# Patient Record
Sex: Female | Born: 1975 | Hispanic: Yes | Marital: Married | Smoking: Former smoker
Health system: Southern US, Community
[De-identification: ages and names within clinical notes are randomized; demographics above are authoritative.]

## PROBLEM LIST (undated history)

## (undated) DIAGNOSIS — M1990 Unspecified osteoarthritis, unspecified site: Secondary | ICD-10-CM

## (undated) DIAGNOSIS — G43909 Migraine, unspecified, not intractable, without status migrainosus: Secondary | ICD-10-CM

## (undated) HISTORY — PX: TUBAL LIGATION: SHX77

## (undated) HISTORY — PX: DILATION AND CURETTAGE OF UTERUS: SHX78

---

## 2004-07-11 ENCOUNTER — Emergency Department: Payer: Self-pay | Admitting: Unknown Physician Specialty

## 2005-08-06 ENCOUNTER — Ambulatory Visit: Payer: Self-pay | Admitting: Internal Medicine

## 2005-08-19 ENCOUNTER — Ambulatory Visit: Payer: Self-pay | Admitting: Internal Medicine

## 2007-07-18 ENCOUNTER — Emergency Department: Payer: Self-pay | Admitting: Emergency Medicine

## 2009-05-24 ENCOUNTER — Emergency Department: Payer: Self-pay | Admitting: Internal Medicine

## 2009-06-16 ENCOUNTER — Ambulatory Visit: Payer: Self-pay | Admitting: Internal Medicine

## 2011-02-08 ENCOUNTER — Emergency Department: Payer: Self-pay | Admitting: Emergency Medicine

## 2015-07-14 ENCOUNTER — Emergency Department
Admission: EM | Admit: 2015-07-14 | Discharge: 2015-07-15 | Disposition: A | Discharge status: Home / Self Care | Payer: BC Managed Care – PPO | Attending: Emergency Medicine | Admitting: Emergency Medicine

## 2015-07-14 ENCOUNTER — Encounter: Payer: Self-pay | Admitting: Emergency Medicine

## 2015-07-14 ENCOUNTER — Emergency Department: Payer: BC Managed Care – PPO

## 2015-07-14 DIAGNOSIS — R079 Chest pain, unspecified: Secondary | ICD-10-CM | POA: Insufficient documentation

## 2015-07-14 DIAGNOSIS — R42 Dizziness and giddiness: Secondary | ICD-10-CM | POA: Insufficient documentation

## 2015-07-14 HISTORY — DX: Migraine, unspecified, not intractable, without status migrainosus: G43.909

## 2015-07-14 LAB — BASIC METABOLIC PANEL
Anion gap: 7 (ref 5–15)
BUN: 12 mg/dL (ref 6–20)
CHLORIDE: 105 mmol/L (ref 101–111)
CO2: 25 mmol/L (ref 22–32)
Calcium: 8.7 mg/dL — ABNORMAL LOW (ref 8.9–10.3)
Creatinine, Ser: 0.77 mg/dL (ref 0.44–1.00)
GFR calc Af Amer: >60 mL/min (ref >60)
GFR calc non Af Amer: >60 mL/min (ref >60)
GLUCOSE: 95 mg/dL (ref 65–99)
POTASSIUM: 3.6 mmol/L (ref 3.5–5.1)
Sodium: 137 mmol/L (ref 135–145)

## 2015-07-14 LAB — CBC
HEMATOCRIT: 42.4 % (ref 35.0–47.0)
Hemoglobin: 14.3 g/dL (ref 12.0–16.0)
MCH: 28.7 pg (ref 26.0–34.0)
MCHC: 33.8 g/dL (ref 32.0–36.0)
MCV: 84.8 fL (ref 80.0–100.0)
Platelets: 283 10*3/uL (ref 150–440)
RBC: 5 MIL/uL (ref 3.80–5.20)
RDW: 12.6 % (ref 11.5–14.5)
WBC: 9.8 10*3/uL (ref 3.6–11.0)

## 2015-07-14 LAB — TROPONIN I

## 2015-07-14 NOTE — ED Provider Notes (Signed)
[facility] Emergency Department Provider Note  ____________________________________________    I have reviewed the triage vital signs and the nursing notes.   HISTORY  Chief Complaint Chest Pain and Dizziness    HPI Angela Mejia is a 40 y.o. female who presents with complaints of chest discomfort and nausea. Patient reports that yesterday she developed nausea and had a brief episode of chest discomfort and felt dizzy. Today she feels well and has no complaints. No shortness of breath. No recent travel. No calf pain or swelling. No history of heart disease.     Past Medical History  Diagnosis Date  . Migraine headache     There are no active problems to display for this patient.   Past Surgical History  Procedure Laterality Date  . Dilation and curettage of uterus      No current outpatient prescriptions on file.  Allergies Review of patient's allergies indicates no known allergies.  No family history on file.  Social History Social History  Substance Use Topics  . Smoking status: Never Smoker   . Smokeless tobacco: None  . Alcohol Use: No    Review of Systems  Constitutional: Negative for fever. Eyes: Negative for redness ENT: Negative for sore throat Cardiovascular: As above Respiratory: Negative for shortness of breath. Gastrointestinal: Negative for abdominal pain Genitourinary: Negative for dysuria. Musculoskeletal: Negative for back pain. Skin: Negative for rash. Neurological: Negative for focal weakness Psychiatric: no anxiety    ____________________________________________   PHYSICAL EXAM:  VITAL SIGNS: ED Triage Vitals  Enc Vitals Group     BP 07/14/15 1839 136/96 mmHg     Pulse Rate 07/14/15 1839 66     Resp 07/14/15 1839 18     Temp 07/14/15 1839 98.6 F (37 C)     Temp Source 07/14/15 1839 Oral     SpO2 07/14/15 1839 97 %     Weight 07/14/15 1839 168 lb (76.204 kg)     Height 07/14/15 1839   (1.499 m)     Head Cir --      Peak Flow --      Pain Score 07/14/15 1840 5     Pain Loc --      Pain Edu? --      Excl. in GC? --      Constitutional: Alert and oriented. Well appearing and in no distress.  Eyes: Conjunctivae are normal. No erythema or injection ENT   Head: Normocephalic and atraumatic.   Mouth/Throat: Mucous membranes are moist. Cardiovascular: Normal rate, regular rhythm. Normal and symmetric distal pulses are present in the upper extremities.  Respiratory: Normal respiratory effort without tachypnea nor retractions. Breath sounds are clear and equal bilaterally.   Genitourinary: deferred Musculoskeletal: Nontender with normal range of motion in all extremities. No lower extremity tenderness nor edema. Neurologic:  Normal speech and language. No gross focal neurologic deficits are appreciated. Skin:  Skin is warm, dry and intact. No rash noted. Psychiatric: Mood and affect are normal. Patient exhibits appropriate insight and judgment.  ____________________________________________    LABS (pertinent positives/negatives)  Labs Reviewed  BASIC METABOLIC PANEL - Abnormal; Notable for the following:    Calcium 8.7 (*)    All other components within normal limits  CBC  TROPONIN I    ____________________________________________   EKG  ED ECG REPORT I, Jene Every, the attending physician, personally viewed and interpreted this ECG.  Date: 07/14/2015 EKG Time: 6:48 PM Rate: 81 Rhythm: normal sinus rhythm QRS Axis:  normal Intervals: normal ST/T Wave abnormalities: normal Conduction Disturbances: none Narrative Interpretation: unremarkable   ____________________________________________    RADIOLOGY  Chest x-ray normal  ____________________________________________   PROCEDURES  Procedure(s) performed: none  Critical Care performed: none  ____________________________________________   INITIAL IMPRESSION / ASSESSMENT AND PLAN  / ED COURSE  Pertinent labs & imaging results that were available during my care of the patient were reviewed by me and considered in my medical decision making (see chart for details).  Patient presents with chest pain that occurred yesterday but none today. Her lab work is unremarkable. Her EKG is normal. Her chest x-ray is benign. This is not consistent with ACS, myocarditis, dissection, PE. I have asked her to follow up with her outpatient provider. If her symptoms return she will come back to the emergency department.  ____________________________________________   FINAL CLINICAL IMPRESSION(S) / ED DIAGNOSES  Final diagnoses:  Chest pain, unspecified chest pain type          [name], MD 07/14/15 2357

## 2015-07-14 NOTE — ED Notes (Signed)
Pt presents to ED with complaints of chest pain, back pain, nausea and dizziness. Pt states vomited once yesterday. Pt denies cough. Pt denies fever.

## 2015-07-14 NOTE — Discharge Instructions (Signed)
Dolor de pecho inespecífico °(Nonspecific Chest Pain) °Suele ser difícil encontrar la causa del dolor de pecho. Siempre existe una posibilidad de que el dolor esté relacionado con algo grave, como un infarto de miocardio o un coágulo sanguíneo en los pulmones. Hay muchas enfermedades que no son potencialmente mortales que pueden causar dolor de pecho. Es importante que concurra a las visitas de control con el médico. ° °CUIDADOS EN EL HOGAR °· Si le recetaron antibióticos, asegúrese de terminarlos, incluso si comienza a sentirse mejor. °· Evite las actividades que le causen dolor de pecho. °· No consuma ningún producto que contenga tabaco, lo que incluye cigarrillos, tabaco de mascar o cigarrillos electrónicos. Si necesita ayuda para dejar de fumar, consulte al médico. °· No beba alcohol. °· Tome los medicamentos solamente como se lo haya indicado el médico. °· Concurra a todas las visitas de control como se lo haya indicado el médico. Esto es importante. Esto incluye otros estudios si el dolor de pecho no desaparece. °· El médico puede indicarle que mantenga la cabeza levantada (elevada) mientras duerme. °· Haga cambios en su estilo de vida según las indicaciones del médico. Estos pueden incluir lo siguiente: °¨ Practicar actividad física con regularidad. Pídale al médico que le sugiera algunas actividades que sean seguras para usted. °¨ Consumir una dieta cardiosaludable. El médico o un especialista en alimentación (nutricionista) pueden ayudarlo a que haga elecciones saludables. °¨ Mantener un peso saludable. °¨ Controlar la diabetes, si es necesario. °¨ Reducir las situaciones de estrés. °SOLICITE AYUDA SI: °· El dolor de pecho no desaparece, incluso después del tratamiento. °· Tiene una erupción cutánea con ampollas en el pecho. °· Tiene fiebre. °SOLICITE AYUDA DE INMEDIATO SI: °· El dolor en el pecho es más intenso. °· La tos empeora, o expectora sangre. °· Siente un dolor intenso en el vientre  (abdomen). °· Se siente muy débil. °· Pierde el conocimiento (se desmaya). °· Tiene escalofríos. °· Tiene una molestia repentina e inexplicable en el pecho. °· Tiene molestias repentinas e inexplicables en los brazos, la espalda, el cuello o la mandíbula. °· Le falta el aire en cualquier momento. °· Comienza a sudar de manera repentina o la piel se le humedece. °· Siente náuseas. °· Vomita. °· Se siente repentinamente mareado o se desmaya. °· Siente que el corazón comienza a latir rápidamente o que se saltea latidos. °Estos síntomas pueden indicar una emergencia. No espere hasta que los síntomas desaparezcan. Solicite atención médica de inmediato. Comuníquese con el servicio de emergencias de su localidad (911 en los [place]). No conduzca por sus propios medios hasta el hospital. °  °Esta información no tiene como fin reemplazar el consejo del médico. Asegúrese de hacerle al médico cualquier pregunta que tenga. °  °Document Released: 07/02/2008 Document Revised: 04/26/2014 °Elsevier Interactive Patient Education ©2016 Elsevier Inc. ° °

## 2017-10-25 ENCOUNTER — Encounter: Payer: Self-pay | Admitting: Emergency Medicine

## 2017-10-25 ENCOUNTER — Ambulatory Visit (INDEPENDENT_AMBULATORY_CARE_PROVIDER_SITE_OTHER): Payer: Self-pay

## 2017-10-25 ENCOUNTER — Ambulatory Visit
Admission: EM | Admit: 2017-10-25 | Discharge: 2017-10-25 | Disposition: A | Discharge status: Home / Self Care | Payer: Self-pay | Attending: Family Medicine | Admitting: Family Medicine

## 2017-10-25 ENCOUNTER — Other Ambulatory Visit: Payer: Self-pay

## 2017-10-25 DIAGNOSIS — Z23 Encounter for immunization: Secondary | ICD-10-CM

## 2017-10-25 DIAGNOSIS — M79645 Pain in left finger(s): Secondary | ICD-10-CM

## 2017-10-25 DIAGNOSIS — R2231 Localized swelling, mass and lump, right upper limb: Secondary | ICD-10-CM

## 2017-10-25 MED ORDER — TETANUS-DIPHTH-ACELL PERTUSSIS 5-2.5-18.5 LF-MCG/0.5 IM SUSP
0.5000 mL | Freq: Once | INTRAMUSCULAR | Status: AC
  Administered 2017-10-25: 0.5 mL via INTRAMUSCULAR

## 2017-10-25 MED ORDER — SULFAMETHOXAZOLE-TRIMETHOPRIM 800-160 MG PO TABS: 1.0000 | ORAL_TABLET | Freq: Two times a day (BID) | ORAL | 0 refills | Status: AC

## 2017-10-25 NOTE — ED Triage Notes (Signed)
Pt is here today c/o of pain, redness and swelling in the third digit of her left hand. No known injury. She tried to work today but was too painful as she works in [employer], [employer].

## 2017-10-25 NOTE — ED Provider Notes (Signed)
[facility] ____________________________________________  Time seen: Approximately 9:33 AM  I have reviewed the triage vital signs and the nursing notes.   HISTORY  Chief Complaint Hand Pain (left)   HPI Angela Mejia is a [age] y.Carlis Stableo. female presenting for evaluation of left third distal finger pain.  Patient reports that she noticed the area felt somewhat irritated yesterday but was not painful.  States today the area was more painful and had to leave work early.  States the pain is distal third finger on the outside with some redness and swelling.  Denies known trauma, insect bite, break in skin.  States that she does get some brittle nails and she pulls the area back sometimes but denies known trigger.  Denies paresthesias, pain radiation, decreased range of motion or other complaints.  States right-hand dominant.  Reports otherwise feels well.  No fevers. Denies recent sickness. Denies recent antibiotic use.  Unsure of last tetanus immunization.   Past Medical History:  Diagnosis Date  . Migraine headache     There are no active problems to display for this patient.   Past Surgical History:  Procedure Laterality Date  . DILATION AND CURETTAGE OF UTERUS       No current facility-administered medications for this encounter.   Current Outpatient Medications:  .  ibuprofen (ADVIL,MOTRIN) 200 MG tablet, Take by mouth., Disp: , Rfl:  .  sulfamethoxazole-trimethoprim (BACTRIM DS,SEPTRA DS) 800-160 MG tablet, Take 1 tablet by mouth 2 (two) times daily for 7 days., Disp: 14 tablet, Rfl: 0  Allergies Patient has no known allergies.  No family history on file.  Social History Social History   Tobacco Use  . Smoking status: Former Games developermoker  . Smokeless tobacco: Never Used  Substance Use Topics  . Alcohol use: Yes    Comment: occasionally  . Drug use: No    Review of Systems Constitutional: No fever/chills Cardiovascular: Denies chest pain. Respiratory:  Denies shortness of breath. Gastrointestinal: No abdominal pain.   Musculoskeletal: as above.  Skin: as above.    ____________________________________________   PHYSICAL EXAM:  VITAL SIGNS: ED Triage Vitals  Enc Vitals Group     BP 10/25/17 0852 124/85     Pulse Rate 10/25/17 0852 62     Resp 10/25/17 0852 17     Temp 10/25/17 0852 98.1 F (36.7 C)     Temp Source 10/25/17 0852 Oral     SpO2 10/25/17 0852 100 %     Weight 10/25/17 0852 162 lb (73.5 kg)     Height 10/25/17 0852 4\' 11"  (1.499 m)     Head Circumference --      Peak Flow --      Pain Score 10/25/17 0850 10     Pain Loc --      Pain Edu? --      Excl. in GC? --     Constitutional: Alert and oriented. Well appearing and in no acute distress. ENT      Head: Normocephalic and atraumatic. Cardiovascular: Normal rate, regular rhythm. Grossly normal heart sounds.  Good peripheral circulation. Respiratory: Normal respiratory effort without tachypnea nor retractions. Breath sounds are clear and equal bilaterally. No wheezes, rales, rhonchi. Musculoskeletal:  Steady gait.  Except: left third distal digit distal phalanx mildly erythematous and edematous, no noted paronychia, small area of peeling skin, no drainage, no fluctuance, pain with resisted flexion and extension but full range of motion present, no motor or tendon deficit. Left hand otherwise nontender and skin  intact.   Neurologic:  Normal speech and language. Speech is normal. No gait instability.  Skin:  Skin is warm, dry Psychiatric: Mood and affect are normal. Speech and behavior are normal. Patient exhibits appropriate insight and judgment   ___________________________________________   LABS (all labs ordered are listed, but only abnormal results are displayed)  Labs Reviewed - No data to display ____________________________________________  RADIOLOGY  Dg Finger Middle Left  Result Date: 10/25/2017 CLINICAL DATA:  Pain, swelling, and redness of the  middle finger began this morning principally over the distal phalanx above the cuticle and distal to the DIP joint. No known injury. EXAM: LEFT MIDDLE FINGER 2+V COMPARISON:  None in PACs FINDINGS: The bones are subjectively adequately mineralized. The joint spaces are well maintained. The soft tissues exhibit no foreign bodies or gas collections nor significant swelling. IMPRESSION: There is no acute bony or soft tissue abnormality demonstrated. Electronically Signed   By: David  [place] M.D.   On: 10/25/2017 09:46   ____________________________________________   PROCEDURES Procedures    INITIAL IMPRESSION / ASSESSMENT AND PLAN / ED COURSE  Pertinent labs & imaging results that were available during my care of the patient were reviewed by me and considered in my medical decision making (see chart for details).  Well appearing patient. No acute distress. TDaP updated.  Left third digit x-ray as above per radiologist, no bony or soft tissue changes noted.  Suspect onset of cellulitis.  Will treat with oral Bactrim.  Encouraged keeping clean, elevation, supportive care.  Discussed cough and return parameters.  Work note given for today and tomorrow.Discussed indication, risks and benefits of medications with patient.  Discussed follow up with Primary care physician this week. Discussed follow up and return parameters including no resolution or any worsening concerns. Patient verbalized understanding and agreed to plan.   ____________________________________________   FINAL CLINICAL IMPRESSION(S) / ED DIAGNOSES  Final diagnoses:  Pain of left middle finger     ED Discharge Orders        Ordered    sulfamethoxazole-trimethoprim (BACTRIM DS,SEPTRA DS) 800-160 MG tablet  2 times daily     10/25/17 0953       Note: This dictation was prepared with Dragon dictation along with smaller phrase technology. Any transcriptional errors that result from this process are unintentional.           Renford Dills, NP 10/25/17 1012

## 2017-10-25 NOTE — Discharge Instructions (Addendum)
Take medication as prescribed. Keep clean. Ice.   Follow up with your primary care physician this week as needed. Return to Urgent care for new or worsening concerns.

## 2017-11-11 ENCOUNTER — Ambulatory Visit
Admission: EM | Admit: 2017-11-11 | Discharge: 2017-11-11 | Disposition: A | Discharge status: Home / Self Care | Payer: Self-pay | Attending: Emergency Medicine | Admitting: Emergency Medicine

## 2017-11-11 ENCOUNTER — Other Ambulatory Visit: Payer: Self-pay

## 2017-11-11 ENCOUNTER — Encounter: Payer: Self-pay | Admitting: Emergency Medicine

## 2017-11-11 DIAGNOSIS — R05 Cough: Secondary | ICD-10-CM

## 2017-11-11 DIAGNOSIS — J069 Acute upper respiratory infection, unspecified: Secondary | ICD-10-CM

## 2017-11-11 DIAGNOSIS — B9789 Other viral agents as the cause of diseases classified elsewhere: Secondary | ICD-10-CM

## 2017-11-11 MED ORDER — HYDROCOD POLST-CPM POLST ER 10-8 MG/5ML PO SUER: 5.0000 mL | Freq: Every evening | ORAL | 0 refills | Status: DC | PRN

## 2017-11-11 MED ORDER — BENZONATATE 100 MG PO CAPS: 100.0000 mg | ORAL_CAPSULE | Freq: Three times a day (TID) | ORAL | 0 refills | Status: DC | PRN

## 2017-11-11 NOTE — Discharge Instructions (Addendum)
Take medication as prescribed. Rest. Drink plenty of fluids.  ° °Follow up with your primary care physician this week as needed. Return to Urgent care for new or worsening concerns.  ° °

## 2017-11-11 NOTE — ED Provider Notes (Signed)
[facility] ____________________________________________  Time seen: Approximately 8:30 AM  I have reviewed the triage vital signs and the nursing notes.   HISTORY  Chief Complaint Cough   HPI Angela Mejia is a 42 y.o. female presenting for evaluation of 4 days of runny nose, nasal congestion, cough.  Reports she did have some scratchy throat now throat only seems dry.  States initially she was having subjective fevers but is since resolved.  States the nasal congestion also is improving.  States that she is continue with a dry hacking cough.  States that she feels sore from coughing so much.  Denies chest pain or shortness of breath but states again just sore from coughing.  Reports her husband recently had similar just prior to her sickness.  Has continued to remain active.  Continues to work.  Worked last night.  Continues to eat and drink.  Has taken some over-the-counter TheraFlu which is helped some.  No other over-the-counter medications taken of the same complaints.  Denies other aggravating factors. Denies recent sickness.   Patient's last menstrual period was 11/01/2017 (approximate). denies pregnancy.    Past Medical History:  Diagnosis Date  . Migraine headache     There are no active problems to display for this patient.   Past Surgical History:  Procedure Laterality Date  . DILATION AND CURETTAGE OF UTERUS       No current facility-administered medications for this encounter.   Current Outpatient Medications:  .  benzonatate (TESSALON PERLES) 100 MG capsule, Take 1 capsule (100 mg total) by mouth 3 (three) times daily as needed for cough., Disp: 15 capsule, Rfl: 0 .  chlorpheniramine-HYDROcodone (TUSSIONEX PENNKINETIC ER) 10-8 MG/5ML SUER, Take 5 mLs by mouth at bedtime as needed for cough. do not drive or operate machinery while taking as can cause drowsiness., Disp: 50 mL, Rfl: 0 .  ibuprofen (ADVIL,MOTRIN) 200 MG tablet, Take by mouth., Disp: ,  Rfl:   Allergies Patient has no known allergies.  History reviewed. No pertinent family history.  Social History Social History   Tobacco Use  . Smoking status: Former Games developer  . Smokeless tobacco: Never Used  Substance Use Topics  . Alcohol use: Yes    Comment: occasionally  . Drug use: No    Review of Systems Constitutional: Subjective fevers. ENT: As above.  Cardiovascular: Denies chest pain. Respiratory: Denies shortness of breath. Gastrointestinal: No abdominal pain.  No nausea, no vomiting.  No diarrhea.   Musculoskeletal: Negative for back pain. Skin: Negative for rash.   ____________________________________________   PHYSICAL EXAM:  VITAL SIGNS: ED Triage Vitals  Enc Vitals Group     BP 11/11/17 0820 112/86     Pulse Rate 11/11/17 0820 71     Resp 11/11/17 0820 14     Temp 11/11/17 0820 98.2 F (36.8 C)     Temp Source 11/11/17 0820 Oral     SpO2 11/11/17 0820 99 %     Weight 11/11/17 0817 162 lb (73.5 kg)     Height 11/11/17 0817 4\' 11"  (1.499 m)     Head Circumference --      Peak Flow --      Pain Score 11/11/17 0816 7     Pain Loc --      Pain Edu? --      Excl. in GC? --     Constitutional: Alert and oriented. Well appearing and in no acute distress. Eyes: Conjunctivae are normal.  Head: Atraumatic. No sinus  tenderness to palpation. No swelling. No erythema.  Ears: no erythema, normal TMs bilaterally.   Nose:Nasal congestion with clear rhinorrhea  Mouth/Throat: Mucous membranes are moist. No pharyngeal erythema. No tonsillar swelling or exudate.  Neck: No stridor.  No cervical spine tenderness to palpation. Hematological/Lymphatic/Immunilogical: No cervical lymphadenopathy. Cardiovascular: Normal rate, regular rhythm. Grossly normal heart sounds.  Good peripheral circulation. Respiratory: Normal respiratory effort.  No retractions. No wheezes, rales or rhonchi. Good air movement.  Musculoskeletal: Ambulatory with steady gait. No cervical,  thoracic or lumbar tenderness to palpation.  Bilateral lower extremities no edema noted.  Bilateral posterior tibialis pulses equal. Neurologic:  Normal speech and language. No gait instability. Skin:  Skin appears warm, dry and intact. No rash noted. Psychiatric: Mood and affect are normal. Speech and behavior are normal.  ___________________________________________   LABS (all labs ordered are listed, but only abnormal results are displayed)  Labs Reviewed - No data to display  PROCEDURES Procedures     INITIAL IMPRESSION / ASSESSMENT AND PLAN / ED COURSE  Pertinent labs & imaging results that were available during my care of the patient were reviewed by me and considered in my medical decision making (see chart for details).  Well-appearing patient.  No acute distress.  Suspect viral upper respiratory infection.Encourage rest, fluids, supportive care.  PRN Tessalon Perles and PRN Tussionex.  Work note given for [employer]. Discussed indication, risks and benefits of medications with patient.  Discussed follow up with Primary care physician this week. Discussed follow up and return parameters including no resolution or any worsening concerns. Patient verbalized understanding and agreed to plan.   ____________________________________________   FINAL CLINICAL IMPRESSION(S) / ED DIAGNOSES  Final diagnoses:  Viral URI with cough     ED Discharge Orders        Ordered    chlorpheniramine-HYDROcodone (TUSSIONEX PENNKINETIC ER) 10-8 MG/5ML SUER  At bedtime PRN     11/11/17 0832    benzonatate (TESSALON PERLES) 100 MG capsule  3 times daily PRN     11/11/17 [id number]       Note: This dictation was prepared with Dragon dictation along with smaller phrase technology. Any transcriptional errors that result from this process are unintentional.         Renford DillsMiller, Kelilah Hebard, NP 11/11/17 [id number]

## 2017-11-11 NOTE — ED Triage Notes (Signed)
Patient c/o cough and chest congestion since Monday.  Patient reports fever yesterday.

## 2018-04-25 ENCOUNTER — Other Ambulatory Visit: Payer: Self-pay

## 2018-04-25 ENCOUNTER — Emergency Department
Admission: EM | Admit: 2018-04-25 | Discharge: 2018-04-25 | Disposition: A | Discharge status: Home / Self Care | Payer: BLUE CROSS/BLUE SHIELD | Attending: Emergency Medicine | Admitting: Emergency Medicine

## 2018-04-25 ENCOUNTER — Encounter: Payer: Self-pay | Admitting: Emergency Medicine

## 2018-04-25 DIAGNOSIS — Z87891 Personal history of nicotine dependence: Secondary | ICD-10-CM | POA: Insufficient documentation

## 2018-04-25 DIAGNOSIS — R42 Dizziness and giddiness: Secondary | ICD-10-CM | POA: Diagnosis present

## 2018-04-25 DIAGNOSIS — R002 Palpitations: Secondary | ICD-10-CM | POA: Diagnosis not present

## 2018-04-25 LAB — CBC
HCT: 41.3 % (ref 36.0–46.0)
HEMOGLOBIN: 14.1 g/dL (ref 12.0–15.0)
MCH: 28.8 pg (ref 26.0–34.0)
MCHC: 34.1 g/dL (ref 30.0–36.0)
MCV: 84.5 fL (ref 80.0–100.0)
Platelets: 309 10*3/uL (ref 150–400)
RBC: 4.89 MIL/uL (ref 3.87–5.11)
RDW: 11.9 % (ref 11.5–15.5)
WBC: 10.8 10*3/uL — ABNORMAL HIGH (ref 4.0–10.5)
nRBC: 0 % (ref 0.0–0.2)

## 2018-04-25 LAB — URINALYSIS, COMPLETE (UACMP) WITH MICROSCOPIC
Bilirubin Urine: NEGATIVE
Glucose, UA: NEGATIVE mg/dL
Ketones, ur: NEGATIVE mg/dL
Leukocytes, UA: NEGATIVE
NITRITE: NEGATIVE
PROTEIN: NEGATIVE mg/dL
Specific Gravity, Urine: 1.011 (ref 1.005–1.030)
WBC UA: NONE SEEN WBC/hpf (ref 0–5)
pH: 7 (ref 5.0–8.0)

## 2018-04-25 LAB — BASIC METABOLIC PANEL
Anion gap: 8 (ref 5–15)
BUN: 16 mg/dL (ref 6–20)
CHLORIDE: 103 mmol/L (ref 98–111)
CO2: 24 mmol/L (ref 22–32)
Calcium: 9.1 mg/dL (ref 8.9–10.3)
Creatinine, Ser: 0.68 mg/dL (ref 0.44–1.00)
GFR calc Af Amer: >60 mL/min (ref >60)
GFR calc non Af Amer: >60 mL/min (ref >60)
Glucose, Bld: 74 mg/dL (ref 70–99)
POTASSIUM: 3.9 mmol/L (ref 3.5–5.1)
Sodium: 135 mmol/L (ref 135–145)

## 2018-04-25 NOTE — ED Triage Notes (Signed)
Pt presents to ED via EMS from work with c/o dizziness. Pt states around 2323 she felt like her heart was racing but it resolved on its own. Occurred again at 0423 but pt reports feeling dizzy and nauseated. Pt states she put her head down for a few min but pt did not have any relief. Pt currently has no symptoms.

## 2018-04-25 NOTE — ED Provider Notes (Signed)
[facility] Emergency Department Provider Note  ____________________________________________  Time seen: Approximately 10:17 AM  I have reviewed the triage vital signs and the nursing notes.   HISTORY  Chief Complaint Dizziness    HPI Angela Mejia is a 43 y.o. female with a history of migraine headache who complains of an episode of dizziness feeling lightheaded and feeling like her heart was racing that started at about 11:30 PM last night.  Lasted for about 20 or 30 minutes and then resolved.  Had another episode at about 4:00.  No syncope or chest pain or shortness of breath.  No vomiting or diaphoresis.  No exertional symptoms.  Denies any family history of sudden death.  Since arrival to the ED, patient has been asymptomatic and remained asymptomatic.      Past Medical History:  Diagnosis Date  . Migraine headache      There are no active problems to display for this patient.    Past Surgical History:  Procedure Laterality Date  . DILATION AND CURETTAGE OF UTERUS       Prior to Admission medications   Medication Sig Start Date End Date Taking? Authorizing Provider  ibuprofen (ADVIL,MOTRIN) 200 MG tablet Take 200 mg by mouth every 6 (six) hours as needed.    Yes [provider]     Allergies Patient has no known allergies.   No family history on file.  Social History Social History   Tobacco Use  . Smoking status: Former Games developer  . Smokeless tobacco: Never Used  Substance Use Topics  . Alcohol use: Yes    Comment: occasionally  . Drug use: No    Review of Systems  Constitutional:   No fever or chills.  ENT:   No sore throat. No rhinorrhea. Cardiovascular: Positive as above palpitations without syncope. Respiratory:   No dyspnea or cough. Gastrointestinal:   Negative for abdominal pain, vomiting and diarrhea.  Musculoskeletal:   Negative for focal pain or swelling All other systems reviewed and are negative  except as documented above in ROS and HPI.  ____________________________________________   PHYSICAL EXAM:  VITAL SIGNS: ED Triage Vitals  Enc Vitals Group     BP 04/25/18 0532 121/87     Pulse Rate 04/25/18 0532 77     Resp 04/25/18 0532 (!) 22     Temp 04/25/18 0532 98.1 F (36.7 C)     Temp Source 04/25/18 0532 Oral     SpO2 04/25/18 0532 100 %     Weight 04/25/18 0533 158 lb (71.7 kg)     Height 04/25/18 0533 4\' 11"  (1.499 m)     Head Circumference --      Peak Flow --      Pain Score 04/25/18 0554 0     Pain Loc --      Pain Edu? --      Excl. in GC? --     Vital signs reviewed, nursing assessments reviewed.   Constitutional:   Alert and oriented. Non-toxic appearance. Eyes:   Conjunctivae are normal. EOMI. PERRL. ENT      Head:   Normocephalic and atraumatic.      Nose:   No congestion/rhinnorhea.       Mouth/Throat:   MMM, no pharyngeal erythema. No peritonsillar mass.       Neck:   No meningismus. Full ROM.  However nonpalpable Hematological/Lymphatic/Immunilogical:   No cervical lymphadenopathy. Cardiovascular:   RRR. Symmetric bilateral radial and DP pulses.  No murmurs.  Cap refill less than 2 seconds. Respiratory:   Normal respiratory effort without tachypnea/retractions. Breath sounds are clear and equal bilaterally. No wheezes/rales/rhonchi. Gastrointestinal:   Soft and nontender. Non distended. There is no CVA tenderness.  No rebound, rigidity, or guarding.  Musculoskeletal:   Normal range of motion in all extremities. No joint effusions.  No lower extremity tenderness.  No edema. Neurologic:   Normal speech and language.  Motor grossly intact. No acute focal neurologic deficits are appreciated.  Skin:    Skin is warm, dry and intact. No rash noted.  No petechiae, purpura, or bullae.  ____________________________________________    LABS (pertinent positives/negatives) (all labs ordered are listed, but only abnormal results are displayed) Labs  Reviewed  CBC - Abnormal; Notable for the following components:      Result Value   WBC 10.8 (*)    All other components within normal limits  URINALYSIS, COMPLETE (UACMP) WITH MICROSCOPIC - Abnormal; Notable for the following components:   Color, Urine STRAW (*)    APPearance CLEAR (*)    Hgb urine dipstick SMALL (*)    Bacteria, UA RARE (*)    All other components within normal limits  BASIC METABOLIC PANEL  CBG MONITORING, ED  POC URINE PREG, ED   ____________________________________________   EKG  Interpreted by me  Date: 04/25/2018  Rate: 70  Rhythm: normal sinus rhythm  QRS Axis: normal  Intervals: normal  ST/T Wave abnormalities: normal  Conduction Disutrbances: none  Narrative Interpretation: unremarkable      ____________________________________________    RADIOLOGY  No results found.  ____________________________________________   PROCEDURES Procedures  ____________________________________________    CLINICAL IMPRESSION / ASSESSMENT AND PLAN / ED COURSE  Pertinent labs & imaging results that were available during my care of the patient were reviewed by me and considered in my medical decision making (see chart for details).    Patient presents with palpitations and dizziness.  No exertional symptoms, no history of syncope or family history of sudden death.  She has no significant medical history, vital signs are normal, doubt hyperthyroidism pheochromocytoma or other endocrine disorder.Considering the patient's symptoms, medical history, and physical examination today, I have low suspicion for ACS, PE, TAD, pneumothorax, carditis, mediastinitis, pneumonia, CHF, or sepsis.  Vital signs labs EKG all normal today.  Recommend follow-up with cardiology for consideration of event monitor.      ____________________________________________   FINAL CLINICAL IMPRESSION(S) / ED DIAGNOSES    Final diagnoses:  Dizziness  Palpitations     ED  Discharge Orders    None      Portions of this note were generated with dragon dictation software. Dictation errors may occur despite best attempts at proofreading.   Sharman Cheek, MD 04/25/18 1019

## 2018-06-25 NOTE — Progress Notes (Deleted)
   New Outpatient Visit Date: 06/26/2018  Referring Provider: Sharman Cheek, MD [facility] Emergency Department  Chief Complaint: ***  HPI:  Angela Mejia is a 43 y.o. female who is being seen today for the evaluation of dizziness and palpitations at the request of Dr. Scotty Court. She has a history of migraine headaches. She presented to the [facility] ED on 04/25/2018 complaining of dizziness and feeling as though her heart were racing.  ED workup was unrevealing.  --------------------------------------------------------------------------------------------------  Cardiovascular History & Procedures: Cardiovascular Problems:  Palpitations and dizziness  Risk Factors:  None  Cath/PCI:  None  CV Surgery:  None  EP Procedures and Devices:  None  Non-Invasive Evaluation(s):  None  Recent CV Pertinent Labs: Lab Results  Component Value Date   K 3.9 04/25/2018   BUN 16 04/25/2018   CREATININE 0.68 04/25/2018    --------------------------------------------------------------------------------------------------  Past Medical History:  Diagnosis Date  . Migraine headache     Past Surgical History:  Procedure Laterality Date  . DILATION AND CURETTAGE OF UTERUS      No outpatient medications have been marked as taking for the 06/26/18 encounter (Appointment) with Oluwadamilare Tobler, Cristal Deer, MD.    Allergies: Patient has no known allergies.  Social History   Tobacco Use  . Smoking status: Former Games developer  . Smokeless tobacco: Never Used  Substance Use Topics  . Alcohol use: Yes    Comment: occasionally  . Drug use: No    No family history on file.  Review of Systems: A 12-system review of systems was performed and was negative except as noted in the HPI.  --------------------------------------------------------------------------------------------------  Physical Exam: There were no vitals taken for this visit.  General:  *** HEENT: No conjunctival pallor or scleral icterus.  Moist mucous membranes. OP clear. Neck: Supple without lymphadenopathy, thyromegaly, JVD, or HJR. No carotid bruit. Lungs: Normal work of breathing. Clear to auscultation bilaterally without wheezes or crackles. Heart: Regular rate and rhythm without murmurs, rubs, or gallops. Non-displaced PMI. Abd: Bowel sounds present. Soft, NT/ND without hepatosplenomegaly Ext: No lower extremity edema. Radial, PT, and DP pulses are 2+ bilaterally Skin: Warm and dry without rash. Neuro: CNIII-XII intact. Strength and fine-touch sensation intact in upper and lower extremities bilaterally. Psych: Normal mood and affect.  EKG:  ***  Lab Results  Component Value Date   WBC 10.8 (H) 04/25/2018   HGB 14.1 04/25/2018   HCT 41.3 04/25/2018   MCV 84.5 04/25/2018   PLT 309 04/25/2018    Lab Results  Component Value Date   NA 135 04/25/2018   K 3.9 04/25/2018   CL 103 04/25/2018   CO2 24 04/25/2018   BUN 16 04/25/2018   CREATININE 0.68 04/25/2018   GLUCOSE 74 04/25/2018    No results found for: CHOL, HDL, LDLCALC, LDLDIRECT, TRIG, CHOLHDL   --------------------------------------------------------------------------------------------------  ASSESSMENT AND PLAN: ***  Yvonne Kendall, MD 06/25/2018 7:45 PM

## 2018-06-26 ENCOUNTER — Ambulatory Visit: Payer: BLUE CROSS/BLUE SHIELD | Admitting: Internal Medicine

## 2018-06-29 ENCOUNTER — Ambulatory Visit: Payer: BLUE CROSS/BLUE SHIELD | Admitting: Internal Medicine

## 2018-11-08 ENCOUNTER — Other Ambulatory Visit: Payer: Self-pay

## 2018-11-08 ENCOUNTER — Observation Stay
Admission: EM | Admit: 2018-11-08 | Discharge: 2018-11-09 | Disposition: A | Discharge status: Home / Self Care | Payer: BC Managed Care – PPO | Attending: Internal Medicine | Admitting: Internal Medicine

## 2018-11-08 ENCOUNTER — Observation Stay: Payer: BC Managed Care – PPO

## 2018-11-08 ENCOUNTER — Emergency Department: Payer: BC Managed Care – PPO

## 2018-11-08 DIAGNOSIS — Z1159 Encounter for screening for other viral diseases: Secondary | ICD-10-CM | POA: Insufficient documentation

## 2018-11-08 DIAGNOSIS — M542 Cervicalgia: Principal | ICD-10-CM | POA: Insufficient documentation

## 2018-11-08 DIAGNOSIS — Z87891 Personal history of nicotine dependence: Secondary | ICD-10-CM | POA: Diagnosis not present

## 2018-11-08 DIAGNOSIS — M79602 Pain in left arm: Secondary | ICD-10-CM | POA: Diagnosis present

## 2018-11-08 DIAGNOSIS — G43909 Migraine, unspecified, not intractable, without status migrainosus: Secondary | ICD-10-CM | POA: Insufficient documentation

## 2018-11-08 DIAGNOSIS — R29898 Other symptoms and signs involving the musculoskeletal system: Secondary | ICD-10-CM | POA: Insufficient documentation

## 2018-11-08 DIAGNOSIS — K219 Gastro-esophageal reflux disease without esophagitis: Secondary | ICD-10-CM | POA: Diagnosis not present

## 2018-11-08 LAB — CBC
HCT: 43.2 % (ref 36.0–46.0)
Hemoglobin: 14.6 g/dL (ref 12.0–15.0)
MCH: 29.3 pg (ref 26.0–34.0)
MCHC: 33.8 g/dL (ref 30.0–36.0)
MCV: 86.6 fL (ref 80.0–100.0)
Platelets: 284 10*3/uL (ref 150–400)
RBC: 4.99 MIL/uL (ref 3.87–5.11)
RDW: 12 % (ref 11.5–15.5)
WBC: 8.5 10*3/uL (ref 4.0–10.5)
nRBC: 0 % (ref 0.0–0.2)

## 2018-11-08 LAB — COMPREHENSIVE METABOLIC PANEL
ALT: 21 U/L (ref 0–44)
AST: 26 U/L (ref 15–41)
Albumin: 4.3 g/dL (ref 3.5–5.0)
Alkaline Phosphatase: 59 U/L (ref 38–126)
Anion gap: 9 (ref 5–15)
BUN: 8 mg/dL (ref 6–20)
CO2: 20 mmol/L — ABNORMAL LOW (ref 22–32)
Calcium: 8.9 mg/dL (ref 8.9–10.3)
Chloride: 106 mmol/L (ref 98–111)
Creatinine, Ser: 0.69 mg/dL (ref 0.44–1.00)
GFR calc Af Amer: >60 mL/min (ref >60)
GFR calc non Af Amer: >60 mL/min (ref >60)
Glucose, Bld: 104 mg/dL — ABNORMAL HIGH (ref 70–99)
Potassium: 3.6 mmol/L (ref 3.5–5.1)
Sodium: 135 mmol/L (ref 135–145)
Total Bilirubin: 0.6 mg/dL (ref 0.3–1.2)
Total Protein: 7.8 g/dL (ref 6.5–8.1)

## 2018-11-08 LAB — POCT PREGNANCY, URINE: Preg Test, Ur: NEGATIVE

## 2018-11-08 LAB — URINALYSIS, COMPLETE (UACMP) WITH MICROSCOPIC
Bacteria, UA: NONE SEEN
Bilirubin Urine: NEGATIVE
Glucose, UA: NEGATIVE mg/dL
Ketones, ur: NEGATIVE mg/dL
Leukocytes,Ua: NEGATIVE
Nitrite: NEGATIVE
Protein, ur: NEGATIVE mg/dL
Specific Gravity, Urine: 1.005 (ref 1.005–1.030)
WBC, UA: NONE SEEN WBC/hpf (ref 0–5)
pH: 7 (ref 5.0–8.0)

## 2018-11-08 LAB — TROPONIN I (HIGH SENSITIVITY): Troponin I (High Sensitivity): <2 ng/L (ref <18)

## 2018-11-08 LAB — LIPASE, BLOOD: Lipase: 27 U/L (ref 11–51)

## 2018-11-08 LAB — SARS CORONAVIRUS 2 BY RT PCR (HOSPITAL ORDER, PERFORMED IN ~~LOC~~ HOSPITAL LAB): SARS Coronavirus 2: NEGATIVE

## 2018-11-08 MED ORDER — ONDANSETRON HCL 4 MG/2ML IJ SOLN
INTRAMUSCULAR | Status: AC
  Administered 2018-11-08: 13:00:00 4 mg via INTRAVENOUS
  Filled 2018-11-08: qty 2

## 2018-11-08 MED ORDER — STROKE: EARLY STAGES OF RECOVERY BOOK
Freq: Once | Status: AC
  Administered 2018-11-08: 18:00:00

## 2018-11-08 MED ORDER — MORPHINE SULFATE (PF) 4 MG/ML IV SOLN
4.0000 mg | Freq: Once | INTRAVENOUS | Status: AC
  Administered 2018-11-08: 13:00:00 4 mg via INTRAVENOUS

## 2018-11-08 MED ORDER — SODIUM CHLORIDE 0.9% FLUSH: 3.0000 mL | Freq: Once | INTRAVENOUS | Status: DC

## 2018-11-08 MED ORDER — ACETAMINOPHEN 160 MG/5ML PO SOLN
650.0000 mg | ORAL | Status: DC | PRN
  Filled 2018-11-08: qty 20.3

## 2018-11-08 MED ORDER — ACETAMINOPHEN 650 MG RE SUPP: 650.0000 mg | RECTAL | Status: DC | PRN

## 2018-11-08 MED ORDER — ASPIRIN 81 MG PO CHEW
324.0000 mg | CHEWABLE_TABLET | Freq: Once | ORAL | Status: AC
  Administered 2018-11-08: 18:00:00 324 mg via ORAL
  Filled 2018-11-08: qty 4

## 2018-11-08 MED ORDER — MORPHINE SULFATE (PF) 4 MG/ML IV SOLN
INTRAVENOUS | Status: AC
  Administered 2018-11-08: 4 mg via INTRAVENOUS
  Filled 2018-11-08: qty 1

## 2018-11-08 MED ORDER — IOHEXOL 350 MG/ML SOLN
75.0000 mL | Freq: Once | INTRAVENOUS | Status: AC | PRN
  Administered 2018-11-08: 75 mL via INTRAVENOUS

## 2018-11-08 MED ORDER — ACETAMINOPHEN 325 MG PO TABS
650.0000 mg | ORAL_TABLET | ORAL | Status: DC | PRN
  Administered 2018-11-08: 18:00:00 650 mg via ORAL
  Filled 2018-11-08: qty 2

## 2018-11-08 MED ORDER — ENOXAPARIN SODIUM 40 MG/0.4ML ~~LOC~~ SOLN
40.0000 mg | SUBCUTANEOUS | Status: DC
  Administered 2018-11-08: 40 mg via SUBCUTANEOUS
  Filled 2018-11-08: qty 0.4

## 2018-11-08 MED ORDER — ONDANSETRON HCL 4 MG/2ML IJ SOLN
4.0000 mg | Freq: Once | INTRAMUSCULAR | Status: AC
  Administered 2018-11-08: 13:00:00 4 mg via INTRAVENOUS

## 2018-11-08 MED ORDER — PANTOPRAZOLE SODIUM 40 MG IV SOLR
40.0000 mg | Freq: Once | INTRAVENOUS | Status: AC
  Administered 2018-11-08: 40 mg via INTRAVENOUS
  Filled 2018-11-08: qty 40

## 2018-11-08 NOTE — Discharge Instructions (Signed)
You were seen today for left arm weakness and numbness.  Your CT scan of the head, neck, and chest were all okay.  We were unable to identify a specific cause of your symptoms today, but there is a risk of an undiagnosed condition that may cause serious disability if not identified.  You elected to leave the hospital against medical advice today, but you are welcome to return any time if you change your mind.  Please follow up with primary care and neurology for further evaluation of these symptoms.  In the meantime, take aspirin 325mg  once a day.

## 2018-11-08 NOTE — ED Notes (Signed)
ED TO INPATIENT HANDOFF REPORT  ED Nurse Name and Phone #:  Helmut Musterlicia [phone]  [phone]  S Name/Age/Gender Carlis StableSue Ellen Mejia 43 y.o. female Room/Bed: ED11A/ED11A  Code Status   Code Status: Full Code  Home/SNF/Other Home Patient oriented to: self, place, time and situation Is this baseline? Yes   Triage Complete: Triage complete  Chief Complaint brought by kc/left arm pain  Triage Note [facility] with c/o neck pain radiating down into the left arm and numbness to the right fingers since yesterday, pt also c/o having increased acid reflux with bloody emesis over the past couple of days.    Allergies No Known Allergies  Level of Care/Admitting Diagnosis ED Disposition    ED Disposition Condition Comment   Admit  Hospital Area: [facility] [[id number]]  Level of Care: Med-Surg [16]  Covid Evaluation: Asymptomatic Screening Protocol (No Symptoms)  Diagnosis: Left arm pain [342322]  Admitting Physician: Katha HammingKONIDENA, SNEHALATHA [[id number]]  Attending Physician: Katha HammingKONIDENA, SNEHALATHA [phone][[id number]]  PT Class (Do Not Modify): Observation [104]  PT Acc Code (Do Not Modify): Observation [10022]       B Medical/Surgery History Past Medical History:  Diagnosis Date  . Migraine headache    Past Surgical History:  Procedure Laterality Date  . DILATION AND CURETTAGE OF UTERUS       A IV Location/Drains/Wounds Patient Lines/Drains/Airways Status   Active Line/Drains/Airways    Name:   Placement date:   Placement time:   Site:   Days:   Peripheral IV 11/08/18 Right Antecubital   11/08/18    1241    Antecubital   less than 1          Intake/Output Last 24 hours No intake or output data in the 24 hours ending 11/08/18 1616  Labs/Imaging Results for orders placed or performed during the hospital encounter of 11/08/18 (from the past 48 hour(s))  Lipase, blood     Status: None   Collection Time: 11/08/18  9:25 AM  Result Value Ref Range   Lipase 27 11 - 51 U/L     Comment: Performed at [facility], [address] [place], [place] [address]  Comprehensive metabolic panel     Status: Abnormal   Collection Time: 11/08/18  9:25 AM  Result Value Ref Range   Sodium 135 135 - 145 mmol/L   Potassium 3.6 3.5 - 5.1 mmol/L   Chloride 106 98 - 111 mmol/L   CO2 20 (L) 22 - 32 mmol/L   Glucose, Bld 104 (H) 70 - 99 mg/dL   BUN 8 6 - 20 mg/dL   Creatinine, Ser 5.620.69 0.44 - 1.00 mg/dL   Calcium 8.9 8.9 - 13.010.3 mg/dL   Total Protein 7.8 6.5 - 8.1 g/dL   Albumin 4.3 3.5 - 5.0 g/dL   AST 26 15 - 41 U/L   ALT 21 0 - 44 U/L   Alkaline Phosphatase 59 38 - 126 U/L   Total Bilirubin 0.6 0.3 - 1.2 mg/dL   GFR calc non Af Amer >60 >60 mL/min   GFR calc Af Amer >60 >60 mL/min   Anion gap 9 5 - 15    Comment: Performed at [facility], [address] [place], [place] [address]  CBC     Status: None   Collection Time: 11/08/18  9:25 AM  Result Value Ref Range   WBC 8.5 4.0 - 10.5 K/uL   RBC 4.99 3.87 - 5.11 MIL/uL   Hemoglobin 14.6 12.0 -  15.0 g/dL   HCT 38.7 56.4 - 33.2 %   MCV 86.6 80.0 - 100.0 fL   MCH 29.3 26.0 - 34.0 pg   MCHC 33.8 30.0 - 36.0 g/dL   RDW 95.1 88.4 - 16.6 %   Platelets 284 150 - 400 K/uL   nRBC 0.0 0.0 - 0.2 %    Comment: Performed at [facility], [address], [place], [place] [address]  Troponin I (High Sensitivity)     Status: None   Collection Time: 11/08/18  9:25 AM  Result Value Ref Range   Troponin I (High Sensitivity) <2 <18 ng/L    Comment: (NOTE) Elevated high sensitivity troponin I (hsTnI) values and significant  changes across serial measurements may suggest ACS but many other  chronic and acute conditions are known to elevate hsTnI results.  Refer to the "Links" section for chest pain algorithms and additional  guidance. Performed at [facility], [address] [place], [place] [address]   Urinalysis, Complete w Microscopic     Status: Abnormal   Collection Time: 11/08/18   9:30 AM  Result Value Ref Range   Color, Urine YELLOW (A) YELLOW   APPearance CLEAR (A) CLEAR   Specific Gravity, Urine 1.005 1.005 - 1.030   pH 7.0 5.0 - 8.0   Glucose, UA NEGATIVE NEGATIVE mg/dL   Hgb urine dipstick LARGE (A) NEGATIVE   Bilirubin Urine NEGATIVE NEGATIVE   Ketones, ur NEGATIVE NEGATIVE mg/dL   Protein, ur NEGATIVE NEGATIVE mg/dL   Nitrite NEGATIVE NEGATIVE   Leukocytes,Ua NEGATIVE NEGATIVE   RBC / HPF 0-5 0 - 5 RBC/hpf   WBC, UA NONE SEEN 0 - 5 WBC/hpf   Bacteria, UA NONE SEEN NONE SEEN   Squamous Epithelial / LPF 0-5 0 - 5    Comment: Performed at [facility], [address] [place], [place] [address]  Pregnancy, urine POC     Status: None   Collection Time: 11/08/18  9:37 AM  Result Value Ref Range   Preg Test, Ur NEGATIVE NEGATIVE    Comment:        THE SENSITIVITY OF THIS METHODOLOGY IS >24 mIU/mL   SARS Coronavirus 2 (CEPHEID - Performed in [facility] hospital lab), Hosp Order     Status: None   Collection Time: 11/08/18 12:31 PM   Specimen: Nasopharyngeal Swab  Result Value Ref Range   SARS Coronavirus 2 NEGATIVE NEGATIVE    Comment: (NOTE) If result is NEGATIVE SARS-CoV-2 target nucleic acids are NOT DETECTED. The SARS-CoV-2 RNA is generally detectable in upper and lower  respiratory specimens during the acute phase of infection. The lowest  concentration of SARS-CoV-2 viral copies this assay can detect is 250  copies / mL. A negative result does not preclude SARS-CoV-2 infection  and should not be used as the sole basis for treatment or other  patient management decisions.  A negative result may occur with  improper specimen collection / handling, submission of specimen other  than nasopharyngeal swab, presence of viral mutation(s) within the  areas targeted by this assay, and inadequate number of viral copies  (<250 copies / mL). A negative result must be combined with clinical  observations, patient history, and epidemiological  information. If result is POSITIVE SARS-CoV-2 target nucleic acids are DETECTED. The SARS-CoV-2 RNA is generally detectable in upper and lower  respiratory specimens dur ing the acute phase of infection.  Positive  results are indicative of active infection with SARS-CoV-2.  Clinical  correlation with patient history and other diagnostic information is  necessary to determine patient infection status.  Positive results do  not rule out bacterial infection or co-infection with other viruses. If result is PRESUMPTIVE POSTIVE SARS-CoV-2 nucleic acids MAY BE PRESENT.   A presumptive positive result was obtained on the submitted specimen  and confirmed on repeat testing.  While 2019 novel coronavirus  (SARS-CoV-2) nucleic acids may be present in the submitted sample  additional confirmatory testing may be necessary for epidemiological  and / or clinical management purposes  to differentiate between  SARS-CoV-2 and other Sarbecovirus currently known to infect humans.  If clinically indicated additional testing with an alternate test  methodology [phone] is advised. The SARS-CoV-2 RNA is generally  detectable in upper and lower respiratory sp ecimens during the acute  phase of infection. The expected result is Negative. Fact Sheet for Patients:  BoilerBrush.com.cyhttps://www.fda.gov/media/136312/download Fact Sheet for Healthcare Providers: https://pope.com/https://www.fda.gov/media/136313/download This test is not yet approved or cleared by the [place] FDA and has been authorized for detection and/or diagnosis of SARS-CoV-2 by FDA under an Emergency Use Authorization (EUA).  This EUA will remain in effect (meaning this test can be used) for the duration of the COVID-19 declaration under Section 564(b)(1) of the Act, 21 U.S.C. section 360bbb-3(b)(1), unless the authorization is terminated or revoked sooner. Performed at [facility], [address] [place], [place] [address]    Ct Head Wo  Contrast  Result Date: 11/08/2018 CLINICAL DATA:  Neck pain radiating into the left arm and numbness in the left fingers since yesterday. No known injury. EXAM: CT HEAD WITHOUT CONTRAST CT CERVICAL SPINE WITHOUT CONTRAST TECHNIQUE: Multidetector CT imaging of the head and cervical spine was performed following the standard protocol without intravenous contrast. Multiplanar CT image reconstructions of the cervical spine were also generated. COMPARISON:  None. FINDINGS: CT HEAD FINDINGS Brain: No evidence of acute infarction, hemorrhage, hydrocephalus, extra-axial collection or mass lesion/mass effect. Vascular: No hyperdense vessel or unexpected calcification. Skull: Intact.  No focal lesion. Sinuses/Orbits: Negative. Other: None. CT CERVICAL SPINE FINDINGS Alignment: Normal. Skull base and vertebrae: No acute fracture. No primary bone lesion or focal pathologic process. Soft tissues and spinal canal: No prevertebral fluid or swelling. No visible canal hematoma. Disc levels: The central canal appears widely patent at all levels. Mild uncovertebral spurring C5-6 without foraminal stenosis noted. Upper chest: Negative. Other: None. IMPRESSION: Normal head CT. No acute abnormality cervical spine. Mild uncovertebral spurring C5-6 without foraminal stenosis. Electronically Signed   By: Drusilla Kannerhomas  Dalessio M.D.   On: 11/08/2018 13:31   Ct Cervical Spine Wo Contrast  Result Date: 11/08/2018 CLINICAL DATA:  Neck pain radiating into the left arm and numbness in the left fingers since yesterday. No known injury. EXAM: CT HEAD WITHOUT CONTRAST CT CERVICAL SPINE WITHOUT CONTRAST TECHNIQUE: Multidetector CT imaging of the head and cervical spine was performed following the standard protocol without intravenous contrast. Multiplanar CT image reconstructions of the cervical spine were also generated. COMPARISON:  None. FINDINGS: CT HEAD FINDINGS Brain: No evidence of acute infarction, hemorrhage, hydrocephalus, extra-axial  collection or mass lesion/mass effect. Vascular: No hyperdense vessel or unexpected calcification. Skull: Intact.  No focal lesion. Sinuses/Orbits: Negative. Other: None. CT CERVICAL SPINE FINDINGS Alignment: Normal. Skull base and vertebrae: No acute fracture. No primary bone lesion or focal pathologic process. Soft tissues and spinal canal: No prevertebral fluid or swelling. No visible canal hematoma. Disc levels: The central canal appears widely patent at all levels. Mild uncovertebral spurring  C5-6 without foraminal stenosis noted. Upper chest: Negative. Other: None. IMPRESSION: Normal head CT. No acute abnormality cervical spine. Mild uncovertebral spurring C5-6 without foraminal stenosis. Electronically Signed   By: Inge Rise M.D.   On: 11/08/2018 13:31   US Venous Img Upper Uni Left  Result Date: 11/08/2018 CLINICAL DATA:  Left upper extremity swelling and pain. EXAM: LEFT UPPER EXTREMITY VENOUS DOPPLER ULTRASOUND TECHNIQUE: Gray-scale sonography with graded compression, as well as color Doppler and duplex ultrasound were performed to evaluate the upper extremity deep venous system from the level of the subclavian vein and including the jugular, axillary, basilic, radial, ulnar and upper cephalic vein. Spectral Doppler was utilized to evaluate flow at rest and with distal augmentation maneuvers. COMPARISON:  None. FINDINGS: Contralateral Subclavian Vein: Respiratory phasicity is normal and symmetric with the symptomatic side. No evidence of thrombus. Normal compressibility. Internal Jugular Vein: No evidence of thrombus. Normal compressibility, respiratory phasicity and response to augmentation. Subclavian Vein: No evidence of thrombus. Normal compressibility, respiratory phasicity and response to augmentation. Axillary Vein: No evidence of thrombus. Normal compressibility, respiratory phasicity and response to augmentation. Cephalic Vein: No evidence of thrombus. Normal compressibility,  respiratory phasicity and response to augmentation. Basilic Vein: No evidence of thrombus. Normal compressibility, respiratory phasicity and response to augmentation. Brachial Veins: No evidence of thrombus. Normal compressibility, respiratory phasicity and response to augmentation. Radial Veins: No evidence of thrombus. Normal compressibility, respiratory phasicity and response to augmentation. Ulnar Veins: No evidence of thrombus. Normal compressibility, respiratory phasicity and response to augmentation. Other Findings:  None visualized. IMPRESSION: No evidence of DVT within the left upper extremity. Electronically Signed   By: Marcello Moores  Register   On: 11/08/2018 13:53   Ct Angio Chest Aorta W And/or Wo Contrast  Result Date: 11/08/2018 CLINICAL DATA:  Chest pain EXAM: CT ANGIOGRAPHY CHEST WITH CONTRAST TECHNIQUE: Multidetector CT imaging of the chest was performed using the standard protocol post prior to and during bolus administration of intravenous contrast in aortic arterial phase. Multiplanar CT image reconstructions and MIPs were obtained to evaluate the vascular anatomy. CONTRAST:  64mL OMNIPAQUE IOHEXOL 350 MG/ML SOLN COMPARISON:  None. FINDINGS: Cardiovascular: Preferential opacification of the thoracic aorta. Evaluation of the aortic root and ascending aorta is somewhat limited by motion artifact. Within this limitation, no evidence of thoracic aortic aneurysm or dissection. Normal contour and caliber. No significant atherosclerosis. Normal heart size. No pericardial effusion. Mediastinum/Nodes: No enlarged mediastinal, hilar, or axillary lymph nodes. Thyroid gland, trachea, and esophagus demonstrate no significant findings. Lungs/Pleura: Lungs are clear. No pleural effusion or pneumothorax. Upper Abdomen: No acute abnormality. Musculoskeletal: No chest wall abnormality. No acute or significant osseous findings. Review of the MIP images confirms the above findings. IMPRESSION: Evaluation of the  aortic root and ascending thoracic aorta is somewhat limited by motion artifact. Within this limitation, normal contour and caliber of the thoracic aorta without evidence of dissection, aneurysm, or other acute aortic syndrome. No significant atherosclerosis. Electronically Signed   By: Eddie Candle M.D.   On: 11/08/2018 13:31    Pending Labs Unresulted Labs (From admission, onward)    Start     Ordered   11/15/18 0500  Creatinine, serum  (enoxaparin (LOVENOX)    CrCl >/= 30 ml/min)  Weekly,   STAT    Comments: while on enoxaparin therapy    11/08/18 1555   11/09/18 0500  Hemoglobin A1c  Tomorrow morning,   STAT     11/08/18 1555   11/09/18 0500  Lipid panel  Tomorrow morning,   STAT    Comments: Fasting    11/08/18 1555   11/08/18 1552  HIV antibody (Routine Testing)  Once,   STAT     11/08/18 1555          Vitals/Pain Today's Vitals   11/08/18 1156 11/08/18 1240 11/08/18 1325 11/08/18 1428  BP:    123/83  Pulse: 61   (!) 59  Resp:    16  Temp:      TempSrc:      SpO2: 100%   100%  Weight:      Height:      PainSc:  10-Worst pain ever 6  2     Isolation Precautions No active isolations  Medications Medications  aspirin chewable tablet 324 mg (has no administration in time range)   stroke: mapping our early stages of recovery book (has no administration in time range)  acetaminophen (TYLENOL) tablet 650 mg (has no administration in time range)    Or  acetaminophen (TYLENOL) solution 650 mg (has no administration in time range)    Or  acetaminophen (TYLENOL) suppository 650 mg (has no administration in time range)  enoxaparin (LOVENOX) injection 40 mg (has no administration in time range)  pantoprazole (PROTONIX) injection 40 mg (40 mg Intravenous Given 11/08/18 1243)  ondansetron (ZOFRAN) injection 4 mg (4 mg Intravenous Given 11/08/18 1242)  morphine 4 MG/ML injection 4 mg (4 mg Intravenous Given 11/08/18 1243)  iohexol (OMNIPAQUE) 350 MG/ML injection 75 mL (75 mLs  Intravenous Contrast Given 11/08/18 1247)    Mobility walks Low fall risk   Focused Assessments Cardiac Assessment Handoff:    Lab Results  Component Value Date   TROPONINI <0.03 07/14/2015   No results found for: DDIMER Does the Patient currently have chest pain? No  , Pulmonary Assessment Handoff:  Lung sounds:  clear bil O2 Device: Room Air   Pain/numbness to L arm radiating down from neck, improved with morphine.   R Recommendations: See Admitting Provider Note  Report given to:   Additional Notes:

## 2018-11-08 NOTE — ED Triage Notes (Signed)
Pt was brought over from [facility] with c/o neck pain radiating down into the left arm and numbness to the right fingers since yesterday, pt also c/o having increased acid reflux with bloody emesis over the past couple of days.

## 2018-11-08 NOTE — ED Provider Notes (Addendum)
[facility] Emergency Department Provider Note  ____________________________________________  Time seen: Approximately 2:17 PM  I have reviewed the triage vital signs and the nursing notes.   HISTORY  Chief Complaint Neck Pain and GI Bleeding    HPI Angela StableSue Ellen Mejia is a 43 y.o. female with only past medical history of migraine headaches who comes to the ED with left-sided neck pain and headache radiating in the left arm associated numbness and weakness of the left upper extremity.  Started yesterday evening suddenly, constant.  No aggravating or alleviating factors.  Pain is severe.  She also notes that she is having worsening GERD symptoms compared to baseline with some blood-tinged vomiting.  No black or bloody stool.  She has been taking omeprazole but stopped because she felt like it was not working.      Past Medical History:  Diagnosis Date  . Migraine headache      There are no active problems to display for this patient.    Past Surgical History:  Procedure Laterality Date  . DILATION AND CURETTAGE OF UTERUS       Prior to Admission medications   Medication Sig Start Date End Date Taking? Authorizing Provider  ibuprofen (ADVIL,MOTRIN) 200 MG tablet Take 200 mg by mouth every 6 (six) hours as needed.     [provider]     Allergies Patient has no known allergies.   No family history on file.  Social History Social History   Tobacco Use  . Smoking status: Former Games developermoker  . Smokeless tobacco: Never Used  Substance Use Topics  . Alcohol use: Yes    Comment: occasionally  . Drug use: No    Review of Systems  Constitutional:   No fever or chills.  ENT:   No sore throat. No rhinorrhea. Cardiovascular:   No chest pain or syncope. Respiratory:   No dyspnea or cough. Gastrointestinal:   Negative for abdominal pain, vomiting and diarrhea.  Musculoskeletal:   Left arm pain and weakness as above All other systems  reviewed and are negative except as documented above in ROS and HPI.  ____________________________________________   PHYSICAL EXAM:  VITAL SIGNS: ED Triage Vitals  Enc Vitals Group     BP 11/08/18 0923 (!) 137/96     Pulse Rate 11/08/18 0923 64     Resp 11/08/18 0923 18     Temp 11/08/18 0923 98.6 F (37 C)     Temp Source 11/08/18 0923 Oral     SpO2 11/08/18 0923 99 %     Weight 11/08/18 0924 171 lb (77.6 kg)     Height 11/08/18 0924 4\' 11"  (1.499 m)     Head Circumference --      Peak Flow --      Pain Score 11/08/18 0924 10     Pain Loc --      Pain Edu? --      Excl. in GC? --     Vital signs reviewed, nursing assessments reviewed.   Constitutional:   Alert and oriented. Non-toxic appearance. Eyes:   Conjunctivae are normal. EOMI. PERRL. ENT      Head:   Normocephalic and atraumatic.      Nose:   No congestion/rhinnorhea.       Mouth/Throat:   MMM, no pharyngeal erythema. No peritonsillar mass.       Neck:   No meningismus. Full ROM. Hematological/Lymphatic/Immunilogical:   No cervical lymphadenopathy. Cardiovascular:   RRR. Symmetric bilateral radial and DP  pulses.  No murmurs. Cap refill less than 2 seconds. Respiratory:   Normal respiratory effort without tachypnea/retractions. Breath sounds are clear and equal bilaterally. No wheezes/rales/rhonchi. Gastrointestinal:   Soft and nontender. Non distended. There is no CVA tenderness.  No rebound, rigidity, or guarding.  Musculoskeletal:   Normal range of motion in all extremities. No joint effusions.  No lower extremity tenderness.  Mild diffuse swelling of left upper extremity without inflammatory changes.. Neurologic:   Normal speech and language.  Diffuse weakness of the left upper extremity.  Sensation is diminished as well. No pronator drift.  Normal finger-to-nose NIH stroke scale 2 Skin:    Skin is warm, dry and intact. No rash noted.  No petechiae, purpura, or  bullae.  ____________________________________________    LABS (pertinent positives/negatives) (all labs ordered are listed, but only abnormal results are displayed) Labs Reviewed  COMPREHENSIVE METABOLIC PANEL - Abnormal; Notable for the following components:      Result Value   CO2 20 (*)    Glucose, Bld 104 (*)    All other components within normal limits  URINALYSIS, COMPLETE (UACMP) WITH MICROSCOPIC - Abnormal; Notable for the following components:   Color, Urine YELLOW (*)    APPearance CLEAR (*)    Hgb urine dipstick LARGE (*)    All other components within normal limits  SARS CORONAVIRUS 2 (HOSPITAL ORDER, [facility])  LIPASE, BLOOD  CBC  POC URINE PREG, ED  POCT PREGNANCY, URINE  TROPONIN I (HIGH SENSITIVITY)   ____________________________________________   EKG    ____________________________________________    RADIOLOGY  Ct Head Wo Contrast  Result Date: 11/08/2018 CLINICAL DATA:  Neck pain radiating into the left arm and numbness in the left fingers since yesterday. No known injury. EXAM: CT HEAD WITHOUT CONTRAST CT CERVICAL SPINE WITHOUT CONTRAST TECHNIQUE: Multidetector CT imaging of the head and cervical spine was performed following the standard protocol without intravenous contrast. Multiplanar CT image reconstructions of the cervical spine were also generated. COMPARISON:  None. FINDINGS: CT HEAD FINDINGS Brain: No evidence of acute infarction, hemorrhage, hydrocephalus, extra-axial collection or mass lesion/mass effect. Vascular: No hyperdense vessel or unexpected calcification. Skull: Intact.  No focal lesion. Sinuses/Orbits: Negative. Other: None. CT CERVICAL SPINE FINDINGS Alignment: Normal. Skull base and vertebrae: No acute fracture. No primary bone lesion or focal pathologic process. Soft tissues and spinal canal: No prevertebral fluid or swelling. No visible canal hematoma. Disc levels: The central canal appears widely patent  at all levels. Mild uncovertebral spurring C5-6 without foraminal stenosis noted. Upper chest: Negative. Other: None. IMPRESSION: Normal head CT. No acute abnormality cervical spine. Mild uncovertebral spurring C5-6 without foraminal stenosis. Electronically Signed   By: Inge Rise M.D.   On: 11/08/2018 13:31   Ct Cervical Spine Wo Contrast  Result Date: 11/08/2018 CLINICAL DATA:  Neck pain radiating into the left arm and numbness in the left fingers since yesterday. No known injury. EXAM: CT HEAD WITHOUT CONTRAST CT CERVICAL SPINE WITHOUT CONTRAST TECHNIQUE: Multidetector CT imaging of the head and cervical spine was performed following the standard protocol without intravenous contrast. Multiplanar CT image reconstructions of the cervical spine were also generated. COMPARISON:  None. FINDINGS: CT HEAD FINDINGS Brain: No evidence of acute infarction, hemorrhage, hydrocephalus, extra-axial collection or mass lesion/mass effect. Vascular: No hyperdense vessel or unexpected calcification. Skull: Intact.  No focal lesion. Sinuses/Orbits: Negative. Other: None. CT CERVICAL SPINE FINDINGS Alignment: Normal. Skull base and vertebrae: No acute fracture. No primary bone lesion or  focal pathologic process. Soft tissues and spinal canal: No prevertebral fluid or swelling. No visible canal hematoma. Disc levels: The central canal appears widely patent at all levels. Mild uncovertebral spurring C5-6 without foraminal stenosis noted. Upper chest: Negative. Other: None. IMPRESSION: Normal head CT. No acute abnormality cervical spine. Mild uncovertebral spurring C5-6 without foraminal stenosis. Electronically Signed   By: Drusilla Kannerhomas  Dalessio M.D.   On: 11/08/2018 13:31   [place] Venous Img Upper Uni Left  Result Date: 11/08/2018 CLINICAL DATA:  Left upper extremity swelling and pain. EXAM: LEFT UPPER EXTREMITY VENOUS DOPPLER ULTRASOUND TECHNIQUE: Gray-scale sonography with graded compression, as well as color Doppler and  duplex ultrasound were performed to evaluate the upper extremity deep venous system from the level of the subclavian vein and including the jugular, axillary, basilic, radial, ulnar and upper cephalic vein. Spectral Doppler was utilized to evaluate flow at rest and with distal augmentation maneuvers. COMPARISON:  None. FINDINGS: Contralateral Subclavian Vein: Respiratory phasicity is normal and symmetric with the symptomatic side. No evidence of thrombus. Normal compressibility. Internal Jugular Vein: No evidence of thrombus. Normal compressibility, respiratory phasicity and response to augmentation. Subclavian Vein: No evidence of thrombus. Normal compressibility, respiratory phasicity and response to augmentation. Axillary Vein: No evidence of thrombus. Normal compressibility, respiratory phasicity and response to augmentation. Cephalic Vein: No evidence of thrombus. Normal compressibility, respiratory phasicity and response to augmentation. Basilic Vein: No evidence of thrombus. Normal compressibility, respiratory phasicity and response to augmentation. Brachial Veins: No evidence of thrombus. Normal compressibility, respiratory phasicity and response to augmentation. Radial Veins: No evidence of thrombus. Normal compressibility, respiratory phasicity and response to augmentation. Ulnar Veins: No evidence of thrombus. Normal compressibility, respiratory phasicity and response to augmentation. Other Findings:  None visualized. IMPRESSION: No evidence of DVT within the left upper extremity. Electronically Signed   By: Maisie Fushomas  Register   On: 11/08/2018 13:53   Ct Angio Chest Aorta W And/or Wo Contrast  Result Date: 11/08/2018 CLINICAL DATA:  Chest pain EXAM: CT ANGIOGRAPHY CHEST WITH CONTRAST TECHNIQUE: Multidetector CT imaging of the chest was performed using the standard protocol post prior to and during bolus administration of intravenous contrast in aortic arterial phase. Multiplanar CT image reconstructions  and MIPs were obtained to evaluate the vascular anatomy. CONTRAST:  75mL OMNIPAQUE IOHEXOL 350 MG/ML SOLN COMPARISON:  None. FINDINGS: Cardiovascular: Preferential opacification of the thoracic aorta. Evaluation of the aortic root and ascending aorta is somewhat limited by motion artifact. Within this limitation, no evidence of thoracic aortic aneurysm or dissection. Normal contour and caliber. No significant atherosclerosis. Normal heart size. No pericardial effusion. Mediastinum/Nodes: No enlarged mediastinal, hilar, or axillary lymph nodes. Thyroid gland, trachea, and esophagus demonstrate no significant findings. Lungs/Pleura: Lungs are clear. No pleural effusion or pneumothorax. Upper Abdomen: No acute abnormality. Musculoskeletal: No chest wall abnormality. No acute or significant osseous findings. Review of the MIP images confirms the above findings. IMPRESSION: Evaluation of the aortic root and ascending thoracic aorta is somewhat limited by motion artifact. Within this limitation, normal contour and caliber of the thoracic aorta without evidence of dissection, aneurysm, or other acute aortic syndrome. No significant atherosclerosis. Electronically Signed   By: Lauralyn PrimesAlex  Bibbey M.D.   On: 11/08/2018 13:31    ____________________________________________   PROCEDURES Procedures  ____________________________________________  DIFFERENTIAL DIAGNOSIS   Intracranial hemorrhage, ischemic stroke, aortic dissection, DVT left upper extremity.  Less likely vertebral or basilar dissection or occlusion.  CLINICAL IMPRESSION / ASSESSMENT AND PLAN / ED COURSE  Medications ordered in the ED:  Medications  pantoprazole (PROTONIX) injection 40 mg (40 mg Intravenous Given 11/08/18 1243)  ondansetron (ZOFRAN) injection 4 mg (4 mg Intravenous Given 11/08/18 1242)  morphine 4 MG/ML injection 4 mg (4 mg Intravenous Given 11/08/18 1243)  iohexol (OMNIPAQUE) 350 MG/ML injection 75 mL (75 mLs Intravenous Contrast Given  11/08/18 1247)    Pertinent labs & imaging results that were available during my care of the patient were reviewed by me and considered in my medical decision making (see chart for details).  Angela Mejia was evaluated in Emergency Department on 11/08/2018 for the symptoms described in the history of present illness. She was evaluated in the context of the global COVID-19 pandemic, which necessitated consideration that the patient might be at risk for infection with the SARS-CoV-2 virus that causes COVID-19. Institutional protocols and algorithms that pertain to the evaluation of patients at risk for COVID-19 are in a state of rapid change based on information released by regulatory bodies including the CDC and federal and state organizations. These policies and algorithms were followed during the patient's care in the ED.   Patient presents with acute onset of left-sided neck pain associated with left arm weakness and paresthesia.  Most concerning for stroke or aortic dissection.  CT scan of the head and neck without contrast were negative.  CT angiogram of the chest also negative.  Unfortunately unable to obtain sufficient studies doing an angiogram of both the chest and neck, but potentially could have follow-up MRI/MRA of the head and neck if needed.  ----------------------------------------- 2:22 PM on 11/08/2018 -----------------------------------------  Work-up negative, vital signs unremarkable but with her acute neurologic deficit, will hospitalize for further work-up and management.  I will give a full dose aspirin for now.  ----------------------------------------- 3:45 PM on 11/08/2018 ----------------------------------------- Hospitalist went to evaluate the patient, who then reported to them that she wants to go home.  She understands the concern for stroke or possibly undiagnosed carotid/vertebral dissection or occlusion.  Will discharge AGAINST MEDICAL ADVICE.  Follow-up  information for neurology clinic given.       ____________________________________________   FINAL CLINICAL IMPRESSION(S) / ED DIAGNOSES    Final diagnoses:  Left arm weakness  Neck pain     ED Discharge Orders    None      Portions of this note were generated with dragon dictation software. Dictation errors may occur despite best attempts at proofreading.   Sharman CheekStafford, Lexus Barletta, MD 11/08/18 1422    Sharman CheekStafford, Gurneet Matarese, MD 11/08/18 [phone]

## 2018-11-08 NOTE — H&P (Signed)
[facility] [place] [facility]   PATIENT NAME: Angela Mejia    MR#:  [id number]  DATE OF BIRTH:  05/02/1975  DATE OF ADMISSION:  11/08/2018  PRIMARY CARE PHYSICIAN: Patient, No Pcp Per   REQUESTING/REFERRING PHYSICIAN: Dr. Scotty CourtStafford  CHIEF COMPLAINT: Left arm pain   Chief Complaint  Patient presents with  . Neck Pain  . GI Bleeding    HISTORY OF PRESENT ILLNESS:  Angela StableSue Ellen Mejia  is a 43 y.o. female with no past medical history comes in with left arm pain started yesterday.  [facility] because of neck pain going down the left arm normal numbness of the fingers, patient had CT of the head, CT of cervical spine both were negative for any acute changes.  Patient told me that her main complaint is left hand pain but but also is improved now she mainly has pain only in the left hand and the arm pain is resolved.  No speech troubles, no double vision, we are asked to admit the patient for stroke work-up.  Patient did not have headache, no weakness in hands and legs. PAST MEDICAL HISTORY:   Past Medical History:  Diagnosis Date  . Migraine headache     PAST SURGICAL HISTOIRY:   Past Surgical History:  Procedure Laterality Date  . DILATION AND CURETTAGE OF UTERUS      SOCIAL HISTORY:   Social History   Tobacco Use  . Smoking status: Former Games developermoker  . Smokeless tobacco: Never Used  Substance Use Topics  . Alcohol use: Yes    Comment: occasionally    FAMILY HISTORY:  No family history on file.  DRUG ALLERGIES:  No Known Allergies  REVIEW OF SYSTEMS:  CONSTITUTIONAL: No fever, fatigue or weakness.  EYES: No blurred or double vision.  EARS, NOSE, AND THROAT: No tinnitus or ear pain.  RESPIRATORY: No cough, shortness of breath, wheezing or hemoptysis.  CARDIOVASCULAR: No chest pain, orthopnea, edema.  GASTROINTESTINAL: No nausea, vomiting, diarrhea or abdominal pain.  GENITOURINARY: No dysuria, hematuria.  ENDOCRINE: No  polyuria, nocturia,  HEMATOLOGY: No anemia, easy bruising or bleeding SKIN: No rash or lesion. MUSCULOSKELETAL: No joint pain or arthritis.   NEUROLOGIC: No tingling, numbness, slight weakness on the left side upper extremity because of the left hand pain. PSYCHIATRY: No anxiety or depression.   MEDICATIONS AT HOME:   Prior to Admission medications   Medication Sig Start Date End Date Taking? Authorizing Provider  ibuprofen (ADVIL,MOTRIN) 200 MG tablet Take 200 mg by mouth every 6 (six) hours as needed.     [provider]      VITAL SIGNS:  Blood pressure 123/83, pulse (!) 59, temperature 98.6 F (37 C), temperature source Oral, resp. rate 16, height 4\' 11"  (1.499 m), weight 77.6 kg, last menstrual period 11/04/2018, SpO2 100 %.  PHYSICAL EXAMINATION:  GENERAL:  43 y.o.-year-old patient lying in the bed with no acute distress.  EYES: Pupils equal, round, reactive to light and accommodation. No scleral icterus. Extraocular muscles intact.  HEENT: Head atraumatic, normocephalic. Oropharynx and nasopharynx clear.  NECK:  Supple, no jugular venous distention. No thyroid enlargement, no tenderness.  LUNGS: Normal breath sounds bilaterally, no wheezing, rales,rhonchi or crepitation. No use of accessory muscles of respiration.  CARDIOVASCULAR: S1, S2 normal. No murmurs, rubs, or gallops.  ABDOMEN: Soft, nontender, nondistended. Bowel sounds present. No organomegaly or mass.  EXTREMITIES: No pedal edema, cyanosis, or clubbing.  NEUROLOGIC: Cranial nerves II through  XII are intact. Muscle strength 5/5 in all extremities. Sensation intact. Gait not checked.  PSYCHIATRIC: The patient is alert and oriented x 3.  SKIN: No obvious rash, lesion, or ulcer.   LABORATORY PANEL:   CBC Recent Labs  Lab 11/08/18 0925  WBC 8.5  HGB 14.6  HCT 43.2  PLT 284   ------------------------------------------------------------------------------------------------------------------  Chemistries   Recent Labs  Lab 11/08/18 0925  NA 135  K 3.6  CL 106  CO2 20*  GLUCOSE 104*  BUN 8  CREATININE 0.69  CALCIUM 8.9  AST 26  ALT 21  ALKPHOS 59  BILITOT 0.6   ------------------------------------------------------------------------------------------------------------------  Cardiac Enzymes No results for input(s): TROPONINI in the last 168 hours. ------------------------------------------------------------------------------------------------------------------  RADIOLOGY:  Dg Chest 2 View  Result Date: 11/08/2018 CLINICAL DATA:  LEFT arm pain EXAM: CHEST - 2 VIEW COMPARISON:  07/14/2015 FINDINGS: Normal heart size, mediastinal contours, and pulmonary vascularity. Lungs clear. No infiltrate, pleural effusion or pneumothorax. Bones unremarkable. IMPRESSION: No acute abnormalities. Electronically Signed   By: Ulyses SouthwardMark  Boles M.D.   On: 11/08/2018 16:25   Ct Head Wo Contrast  Result Date: 11/08/2018 CLINICAL DATA:  Neck pain radiating into the left arm and numbness in the left fingers since yesterday. No known injury. EXAM: CT HEAD WITHOUT CONTRAST CT CERVICAL SPINE WITHOUT CONTRAST TECHNIQUE: Multidetector CT imaging of the head and cervical spine was performed following the standard protocol without intravenous contrast. Multiplanar CT image reconstructions of the cervical spine were also generated. COMPARISON:  None. FINDINGS: CT HEAD FINDINGS Brain: No evidence of acute infarction, hemorrhage, hydrocephalus, extra-axial collection or mass lesion/mass effect. Vascular: No hyperdense vessel or unexpected calcification. Skull: Intact.  No focal lesion. Sinuses/Orbits: Negative. Other: None. CT CERVICAL SPINE FINDINGS Alignment: Normal. Skull base and vertebrae: No acute fracture. No primary bone lesion or focal pathologic process. Soft tissues and spinal canal: No prevertebral fluid or swelling. No visible canal hematoma. Disc levels: The central canal appears widely patent at all levels. Mild  uncovertebral spurring C5-6 without foraminal stenosis noted. Upper chest: Negative. Other: None. IMPRESSION: Normal head CT. No acute abnormality cervical spine. Mild uncovertebral spurring C5-6 without foraminal stenosis. Electronically Signed   By: Drusilla Kannerhomas  Dalessio M.D.   On: 11/08/2018 13:31   Ct Cervical Spine Wo Contrast  Result Date: 11/08/2018 CLINICAL DATA:  Neck pain radiating into the left arm and numbness in the left fingers since yesterday. No known injury. EXAM: CT HEAD WITHOUT CONTRAST CT CERVICAL SPINE WITHOUT CONTRAST TECHNIQUE: Multidetector CT imaging of the head and cervical spine was performed following the standard protocol without intravenous contrast. Multiplanar CT image reconstructions of the cervical spine were also generated. COMPARISON:  None. FINDINGS: CT HEAD FINDINGS Brain: No evidence of acute infarction, hemorrhage, hydrocephalus, extra-axial collection or mass lesion/mass effect. Vascular: No hyperdense vessel or unexpected calcification. Skull: Intact.  No focal lesion. Sinuses/Orbits: Negative. Other: None. CT CERVICAL SPINE FINDINGS Alignment: Normal. Skull base and vertebrae: No acute fracture. No primary bone lesion or focal pathologic process. Soft tissues and spinal canal: No prevertebral fluid or swelling. No visible canal hematoma. Disc levels: The central canal appears widely patent at all levels. Mild uncovertebral spurring C5-6 without foraminal stenosis noted. Upper chest: Negative. Other: None. IMPRESSION: Normal head CT. No acute abnormality cervical spine. Mild uncovertebral spurring C5-6 without foraminal stenosis. Electronically Signed   By: Drusilla Kannerhomas  Dalessio M.D.   On: 11/08/2018 13:31   [place] Venous Img Upper Uni Left  Result Date: 11/08/2018 CLINICAL DATA:  Left upper  extremity swelling and pain. EXAM: LEFT UPPER EXTREMITY VENOUS DOPPLER ULTRASOUND TECHNIQUE: Gray-scale sonography with graded compression, as well as color Doppler and duplex ultrasound were  performed to evaluate the upper extremity deep venous system from the level of the subclavian vein and including the jugular, axillary, basilic, radial, ulnar and upper cephalic vein. Spectral Doppler was utilized to evaluate flow at rest and with distal augmentation maneuvers. COMPARISON:  None. FINDINGS: Contralateral Subclavian Vein: Respiratory phasicity is normal and symmetric with the symptomatic side. No evidence of thrombus. Normal compressibility. Internal Jugular Vein: No evidence of thrombus. Normal compressibility, respiratory phasicity and response to augmentation. Subclavian Vein: No evidence of thrombus. Normal compressibility, respiratory phasicity and response to augmentation. Axillary Vein: No evidence of thrombus. Normal compressibility, respiratory phasicity and response to augmentation. Cephalic Vein: No evidence of thrombus. Normal compressibility, respiratory phasicity and response to augmentation. Basilic Vein: No evidence of thrombus. Normal compressibility, respiratory phasicity and response to augmentation. Brachial Veins: No evidence of thrombus. Normal compressibility, respiratory phasicity and response to augmentation. Radial Veins: No evidence of thrombus. Normal compressibility, respiratory phasicity and response to augmentation. Ulnar Veins: No evidence of thrombus. Normal compressibility, respiratory phasicity and response to augmentation. Other Findings:  None visualized. IMPRESSION: No evidence of DVT within the left upper extremity. Electronically Signed   By: Marcello Moores  Register   On: 11/08/2018 13:53   Ct Angio Chest Aorta W And/or Wo Contrast  Result Date: 11/08/2018 CLINICAL DATA:  Chest pain EXAM: CT ANGIOGRAPHY CHEST WITH CONTRAST TECHNIQUE: Multidetector CT imaging of the chest was performed using the standard protocol post prior to and during bolus administration of intravenous contrast in aortic arterial phase. Multiplanar CT image reconstructions and MIPs were obtained  to evaluate the vascular anatomy. CONTRAST:  44mL OMNIPAQUE IOHEXOL 350 MG/ML SOLN COMPARISON:  None. FINDINGS: Cardiovascular: Preferential opacification of the thoracic aorta. Evaluation of the aortic root and ascending aorta is somewhat limited by motion artifact. Within this limitation, no evidence of thoracic aortic aneurysm or dissection. Normal contour and caliber. No significant atherosclerosis. Normal heart size. No pericardial effusion. Mediastinum/Nodes: No enlarged mediastinal, hilar, or axillary lymph nodes. Thyroid gland, trachea, and esophagus demonstrate no significant findings. Lungs/Pleura: Lungs are clear. No pleural effusion or pneumothorax. Upper Abdomen: No acute abnormality. Musculoskeletal: No chest wall abnormality. No acute or significant osseous findings. Review of the MIP images confirms the above findings. IMPRESSION: Evaluation of the aortic root and ascending thoracic aorta is somewhat limited by motion artifact. Within this limitation, normal contour and caliber of the thoracic aorta without evidence of dissection, aneurysm, or other acute aortic syndrome. No significant atherosclerosis. Electronically Signed   By: Eddie Candle M.D.   On: 11/08/2018 13:31    EKG:   Orders placed or performed during the hospital encounter of 04/25/18  . EKG 12-Lead  . EKG 12-Lead  . ED EKG  . ED EKG  . EKG    IMPRESSION AND PLAN:  44 year old obese female with left hand pain, left neck pain since yesterday.  Patient complained of bloody emesis to ER physician but did not report that to me.  1.  Left hand pain, evaluate for underlying TIA, admitted to stroke unit, will do MRI of the brain, ultrasound of carotids, echocardiogram, neurochecks, physical therapy, patient P, speech therapy evaluation, initial NIH stroke scale of 0. 2.  Left neck pain, cervical spine CT is negative thus done in the emergency room 3.  GERD: Continue PPI.  DVT prophylaxis Lovenox. Patient's COVID-19 test is  negative.   All the records are reviewed and case discussed with ED provider. Management plans discussed with the patient, family and they are in agreement.  CODE STATUS: Full code  TOTAL TIME TAKING CARE OF THIS PATIENT: 55 minutes.    Katha HammingSnehalatha Minsa Weddington M.D on 11/08/2018 at 4:34 PM  Between 7am to 6pm - Pager - [phone]  After 6pm go to [email or web address] - password [email or web address]  [facility]  Office  [phone]  CC: Primary care physician; Patient, No Pcp Per  Note: This dictation was prepared with Dragon dictation along with smaller phrase technology. Any transcriptional errors that result from this process are unintentional.

## 2018-11-09 LAB — HEMOGLOBIN A1C
Hgb A1c MFr Bld: 4.9 % (ref 4.8–5.6)
Mean Plasma Glucose: 93.93 mg/dL

## 2018-11-09 LAB — LIPID PANEL
Cholesterol: 207 mg/dL — ABNORMAL HIGH (ref 0–200)
HDL: 67 mg/dL (ref >40)
LDL Cholesterol: 124 mg/dL — ABNORMAL HIGH (ref 0–99)
Total CHOL/HDL Ratio: 3.1 RATIO
Triglycerides: 78 mg/dL (ref <150)
VLDL: 16 mg/dL (ref 0–40)

## 2018-11-09 NOTE — Discharge Summary (Signed)
[employer] - [place] at [facility]   PATIENT NAME: Angela Mejia    MR#:  [id number]  DATE OF BIRTH:  09-17-1975  DATE OF ADMISSION:  11/08/2018 ADMITTING PHYSICIAN: Katha HammingSnehalatha Konidena, MD  DATE OF DISCHARGE: 11/09/2018  PRIMARY CARE PHYSICIAN: Patient, No Pcp Per    ADMISSION DIAGNOSIS:  Neck pain [M54.2] Left arm pain [M79.602] Left arm weakness [R29.898]  DISCHARGE DIAGNOSIS:  Active Problems:   Left arm pain   SECONDARY DIAGNOSIS:   Past Medical History:  Diagnosis Date  . Migraine headache     HOSPITAL COURSE:   43 year old female with a history of migraine headaches who presented with left arm pain.  1.  Left arm pain: Patient had CT scan of the head as well as brain MRI which essentially was unremarkable.  Carotid Doppler showed no hemodynamically significant stenosis.  Left arm pain has resolved.  I suspect this is more related to her occupation.  Cervical spine was also unremarkable.   DISCHARGE CONDITIONS AND DIET:  Stable regular diet  CONSULTS OBTAINED:    DRUG ALLERGIES:  No Known Allergies  DISCHARGE MEDICATIONS:   Allergies as of 11/09/2018   No Known Allergies     Medication List    TAKE these medications   ibuprofen 200 MG tablet Commonly known as: ADVIL Take 200 mg by mouth every 6 (six) hours as needed.         Today   CHIEF COMPLAINT:  No pain, numbness or tingling.   VITAL SIGNS:  Blood pressure 110/73, pulse 60, temperature 97.9 F (36.6 C), temperature source Oral, resp. rate 18, height 4\' 11"  (1.499 m), weight 77.6 kg, last menstrual period 11/04/2018, SpO2 100 %.   REVIEW OF SYSTEMS:  Review of Systems  Constitutional: Negative.  Negative for chills, fever and malaise/fatigue.  HENT: Negative.  Negative for ear discharge, ear pain, hearing loss, nosebleeds and sore throat.   Eyes: Negative.  Negative for blurred vision and pain.  Respiratory: Negative.  Negative for cough, hemoptysis,  shortness of breath and wheezing.   Cardiovascular: Negative.  Negative for chest pain, palpitations and leg swelling.  Gastrointestinal: Negative.  Negative for abdominal pain, blood in stool, diarrhea, nausea and vomiting.  Genitourinary: Negative.  Negative for dysuria.  Musculoskeletal: Negative.  Negative for back pain.  Skin: Negative.   Neurological: Negative for dizziness, tremors, speech change, focal weakness, seizures and headaches.  Endo/Heme/Allergies: Negative.  Does not bruise/bleed easily.  Psychiatric/Behavioral: Negative.  Negative for depression, hallucinations and suicidal ideas.     PHYSICAL EXAMINATION:  GENERAL:  43 y.o.-year-old patient lying in the bed with no acute distress.  NECK:  Supple, no jugular venous distention. No thyroid enlargement, no tenderness.  LUNGS: Normal breath sounds bilaterally, no wheezing, rales,rhonchi  No use of accessory muscles of respiration.  CARDIOVASCULAR: S1, S2 normal. No murmurs, rubs, or gallops.  ABDOMEN: Soft, non-tender, non-distended. Bowel sounds present. No organomegaly or mass.  EXTREMITIES: No pedal edema, cyanosis, or clubbing.  PSYCHIATRIC: The patient is alert and oriented x 3.  SKIN: No obvious rash, lesion, or ulcer.   DATA REVIEW:   CBC Recent Labs  Lab 11/08/18 0925  WBC 8.5  HGB 14.6  HCT 43.2  PLT 284    Chemistries  Recent Labs  Lab 11/08/18 0925  NA 135  K 3.6  CL 106  CO2 20*  GLUCOSE 104*  BUN 8  CREATININE 0.69  CALCIUM 8.9  AST 26  ALT 21  ALKPHOS 59  BILITOT 0.6    Cardiac Enzymes No results for input(s): TROPONINI in the last 168 hours.  Microbiology Results  @MICRORSLT48 @  RADIOLOGY:  Dg Chest 2 View  Result Date: 11/08/2018 CLINICAL DATA:  LEFT arm pain EXAM: CHEST - 2 VIEW COMPARISON:  07/14/2015 FINDINGS: Normal heart size, mediastinal contours, and pulmonary vascularity. Lungs clear. No infiltrate, pleural effusion or pneumothorax. Bones unremarkable. IMPRESSION: No  acute abnormalities. Electronically Signed   By: Ulyses SouthwardMark  Boles M.D.   On: 11/08/2018 16:25   Ct Head Wo Contrast  Result Date: 11/08/2018 CLINICAL DATA:  Neck pain radiating into the left arm and numbness in the left fingers since yesterday. No known injury. EXAM: CT HEAD WITHOUT CONTRAST CT CERVICAL SPINE WITHOUT CONTRAST TECHNIQUE: Multidetector CT imaging of the head and cervical spine was performed following the standard protocol without intravenous contrast. Multiplanar CT image reconstructions of the cervical spine were also generated. COMPARISON:  None. FINDINGS: CT HEAD FINDINGS Brain: No evidence of acute infarction, hemorrhage, hydrocephalus, extra-axial collection or mass lesion/mass effect. Vascular: No hyperdense vessel or unexpected calcification. Skull: Intact.  No focal lesion. Sinuses/Orbits: Negative. Other: None. CT CERVICAL SPINE FINDINGS Alignment: Normal. Skull base and vertebrae: No acute fracture. No primary bone lesion or focal pathologic process. Soft tissues and spinal canal: No prevertebral fluid or swelling. No visible canal hematoma. Disc levels: The central canal appears widely patent at all levels. Mild uncovertebral spurring C5-6 without foraminal stenosis noted. Upper chest: Negative. Other: None. IMPRESSION: Normal head CT. No acute abnormality cervical spine. Mild uncovertebral spurring C5-6 without foraminal stenosis. Electronically Signed   By: Drusilla Kannerhomas  Dalessio M.D.   On: 11/08/2018 13:31   Ct Cervical Spine Wo Contrast  Result Date: 11/08/2018 CLINICAL DATA:  Neck pain radiating into the left arm and numbness in the left fingers since yesterday. No known injury. EXAM: CT HEAD WITHOUT CONTRAST CT CERVICAL SPINE WITHOUT CONTRAST TECHNIQUE: Multidetector CT imaging of the head and cervical spine was performed following the standard protocol without intravenous contrast. Multiplanar CT image reconstructions of the cervical spine were also generated. COMPARISON:  None.  FINDINGS: CT HEAD FINDINGS Brain: No evidence of acute infarction, hemorrhage, hydrocephalus, extra-axial collection or mass lesion/mass effect. Vascular: No hyperdense vessel or unexpected calcification. Skull: Intact.  No focal lesion. Sinuses/Orbits: Negative. Other: None. CT CERVICAL SPINE FINDINGS Alignment: Normal. Skull base and vertebrae: No acute fracture. No primary bone lesion or focal pathologic process. Soft tissues and spinal canal: No prevertebral fluid or swelling. No visible canal hematoma. Disc levels: The central canal appears widely patent at all levels. Mild uncovertebral spurring C5-6 without foraminal stenosis noted. Upper chest: Negative. Other: None. IMPRESSION: Normal head CT. No acute abnormality cervical spine. Mild uncovertebral spurring C5-6 without foraminal stenosis. Electronically Signed   By: Drusilla Kannerhomas  Dalessio M.D.   On: 11/08/2018 13:31   Mr Brain Wo Contrast  Result Date: 11/08/2018 CLINICAL DATA:  TIA.  Neck pain with left arm numbness EXAM: MRI HEAD WITHOUT CONTRAST TECHNIQUE: Multiplanar, multiecho pulse sequences of the brain and surrounding structures were obtained without intravenous contrast. COMPARISON:  CT head 11/08/2018 FINDINGS: Brain: Ventricle size normal. Negative for infarct, hemorrhage, mass. Small hyperintensity right frontal white matter otherwise normal white matter. Normal brainstem. Vascular: Normal arterial flow void Skull and upper cervical spine: Negative Sinuses/Orbits: Negative Other: None IMPRESSION: No acute intracranial abnormality. No acute infarct. Small hyperintensity right frontal white matter otherwise normal exam. Electronically Signed   By: Marlan Palauharles  Clark M.D.   On: 11/08/2018 20:21  [place] Carotid Bilateral (at [facility] And [facility] Only)  Result Date: 11/09/2018 CLINICAL DATA:  Left arm pain EXAM: BILATERAL CAROTID DUPLEX ULTRASOUND TECHNIQUE: Wallace CullensGray scale imaging, color Doppler and duplex ultrasound were performed of bilateral carotid and vertebral  arteries in the neck. COMPARISON:  None. FINDINGS: Criteria: Quantification of carotid stenosis is based on velocity parameters that correlate the residual internal carotid diameter with NASCET-based stenosis levels, using the diameter of the distal internal carotid lumen as the denominator for stenosis measurement. The following velocity measurements were obtained: RIGHT ICA: 79/33 cm/sec CCA: 93/28 cm/sec SYSTOLIC ICA/CCA RATIO:  0.8 ECA: 46 cm/sec LEFT ICA: 67/38 cm/sec CCA: 84/22 cm/sec SYSTOLIC ICA/CCA RATIO:  0.8 ECA: 73 cm/sec RIGHT CAROTID ARTERY: Minor echogenic shadowing plaque formation. No hemodynamically significant right ICA stenosis, velocity elevation, or turbulent flow. Degree of narrowing less than 50%. RIGHT VERTEBRAL ARTERY:  Antegrade LEFT CAROTID ARTERY: Similar scattered minor echogenic plaque formation. No hemodynamically significant left ICA stenosis, velocity elevation, or turbulent flow. LEFT VERTEBRAL ARTERY:  Antegrade IMPRESSION: Very minor carotid atherosclerosis. No hemodynamically significant ICA stenosis. Degree of narrowing less than 50% bilaterally by ultrasound criteria. Patent antegrade vertebral flow bilaterally Electronically Signed   By: Judie PetitM.  Shick M.D.   On: 11/09/2018 08:03   [place] Venous Img Upper Uni Left  Result Date: 11/08/2018 CLINICAL DATA:  Left upper extremity swelling and pain. EXAM: LEFT UPPER EXTREMITY VENOUS DOPPLER ULTRASOUND TECHNIQUE: Gray-scale sonography with graded compression, as well as color Doppler and duplex ultrasound were performed to evaluate the upper extremity deep venous system from the level of the subclavian vein and including the jugular, axillary, basilic, radial, ulnar and upper cephalic vein. Spectral Doppler was utilized to evaluate flow at rest and with distal augmentation maneuvers. COMPARISON:  None. FINDINGS: Contralateral Subclavian Vein: Respiratory phasicity is normal and symmetric with the symptomatic side. No evidence of  thrombus. Normal compressibility. Internal Jugular Vein: No evidence of thrombus. Normal compressibility, respiratory phasicity and response to augmentation. Subclavian Vein: No evidence of thrombus. Normal compressibility, respiratory phasicity and response to augmentation. Axillary Vein: No evidence of thrombus. Normal compressibility, respiratory phasicity and response to augmentation. Cephalic Vein: No evidence of thrombus. Normal compressibility, respiratory phasicity and response to augmentation. Basilic Vein: No evidence of thrombus. Normal compressibility, respiratory phasicity and response to augmentation. Brachial Veins: No evidence of thrombus. Normal compressibility, respiratory phasicity and response to augmentation. Radial Veins: No evidence of thrombus. Normal compressibility, respiratory phasicity and response to augmentation. Ulnar Veins: No evidence of thrombus. Normal compressibility, respiratory phasicity and response to augmentation. Other Findings:  None visualized. IMPRESSION: No evidence of DVT within the left upper extremity. Electronically Signed   By: Maisie Fushomas  Register   On: 11/08/2018 13:53   Ct Angio Chest Aorta W And/or Wo Contrast  Result Date: 11/08/2018 CLINICAL DATA:  Chest pain EXAM: CT ANGIOGRAPHY CHEST WITH CONTRAST TECHNIQUE: Multidetector CT imaging of the chest was performed using the standard protocol post prior to and during bolus administration of intravenous contrast in aortic arterial phase. Multiplanar CT image reconstructions and MIPs were obtained to evaluate the vascular anatomy. CONTRAST:  75mL OMNIPAQUE IOHEXOL 350 MG/ML SOLN COMPARISON:  None. FINDINGS: Cardiovascular: Preferential opacification of the thoracic aorta. Evaluation of the aortic root and ascending aorta is somewhat limited by motion artifact. Within this limitation, no evidence of thoracic aortic aneurysm or dissection. Normal contour and caliber. No significant atherosclerosis. Normal heart size.  No pericardial effusion. Mediastinum/Nodes: No enlarged mediastinal, hilar, or axillary lymph nodes. Thyroid gland, trachea, and  esophagus demonstrate no significant findings. Lungs/Pleura: Lungs are clear. No pleural effusion or pneumothorax. Upper Abdomen: No acute abnormality. Musculoskeletal: No chest wall abnormality. No acute or significant osseous findings. Review of the MIP images confirms the above findings. IMPRESSION: Evaluation of the aortic root and ascending thoracic aorta is somewhat limited by motion artifact. Within this limitation, normal contour and caliber of the thoracic aorta without evidence of dissection, aneurysm, or other acute aortic syndrome. No significant atherosclerosis. Electronically Signed   By: Eddie Candle M.D.   On: 11/08/2018 13:31      Allergies as of 11/09/2018   No Known Allergies     Medication List    TAKE these medications   ibuprofen 200 MG tablet Commonly known as: ADVIL Take 200 mg by mouth every 6 (six) hours as needed.          Management plans discussed with the patient and she is in agreement. Stable for discharge home  Patient should follow up with pcp  CODE STATUS:     Code Status Orders  (From admission, onward)         Start     Ordered   11/08/18 1553  Full code  Continuous     11/08/18 1555        Code Status History    This patient has a current code status but no historical code status.   Advance Care Planning Activity      TOTAL TIME TAKING CARE OF THIS PATIENT: 38 minutes.    Note: This dictation was prepared with Dragon dictation along with smaller phrase technology. Any transcriptional errors that result from this process are unintentional.  Bettey Costa M.D on 11/09/2018 at 10:46 AM  Between 7am to 6pm - Pager - [phone] After 6pm go to [email or web address] - password [email or web address] [facility]  Office  [phone]  CC: Primary care physician; Patient, No Pcp Per

## 2018-11-09 NOTE — Progress Notes (Signed)
OT Screen Note  Patient Details Name: Angela Mejia MRN: [id number] DOB: 1975/08/28   OT Screen:    Reason Eval/Treat Not Completed: OT screened, no needs identified, will sign off. Order received, chart reviewed. Per notes and conversation with Physical Therapy, pt endorsing symptoms have generally resolved. Physical therapist stated this pt has been up in room to complete ADLs including showering without assist on this date. No skilled OT needs identified. Will sign off. Please re-consult if additional needs arise during this admission.   Shara Blazing, M.S., OTR/L Ascom: [phone] 11/09/18, 10:18 AM

## 2018-11-09 NOTE — Progress Notes (Signed)
   11/09/18 0900  Clinical Encounter Type  Visited With Patient  Visit Type Initial  Referral From Nurse  Spiritual Encounters  Spiritual Needs Other (Comment) (Advance Directives Education)  Ch received an OR for AD education. Pt wants her husband to be HCPOA. Pt is expecting to be discharged before noon and said her husband is waiting outside in the parking lot. Pt seems content with the test result which shows no sign of stroke and is ready to go home. Ch will be back once pt is ready to complete the document.

## 2018-11-09 NOTE — Progress Notes (Signed)
PT Cancellation Note  Patient Details Name: Angela Mejia MRN: [id number] DOB: November 10, 1975   Cancelled Treatment:    Reason Eval/Treat Not Completed: (Chart reviewed, RN consulted, and PT spoke with pt. Reported resolvement of symptoms/return to baseline, has been up and walking independently, and currently prepping to get in the shower. PT to sign off, please re-consult if any acute PT needs arise.)   Lieutenant Diego PT, DPT 9:59 AM,11/09/18 [phone]

## 2018-11-10 LAB — HIV ANTIBODY (ROUTINE TESTING W REFLEX): HIV Screen 4th Generation wRfx: NONREACTIVE

## 2019-01-01 ENCOUNTER — Encounter: Payer: Self-pay | Admitting: Emergency Medicine

## 2019-01-01 ENCOUNTER — Ambulatory Visit (INDEPENDENT_AMBULATORY_CARE_PROVIDER_SITE_OTHER): Payer: BC Managed Care – PPO

## 2019-01-01 ENCOUNTER — Ambulatory Visit
Admission: EM | Admit: 2019-01-01 | Discharge: 2019-01-01 | Disposition: A | Discharge status: Home / Self Care | Payer: BC Managed Care – PPO | Attending: Family Medicine | Admitting: Family Medicine

## 2019-01-01 ENCOUNTER — Other Ambulatory Visit: Payer: Self-pay

## 2019-01-01 DIAGNOSIS — W182XXA Fall in (into) shower or empty bathtub, initial encounter: Secondary | ICD-10-CM | POA: Diagnosis not present

## 2019-01-01 DIAGNOSIS — S20211A Contusion of right front wall of thorax, initial encounter: Secondary | ICD-10-CM

## 2019-01-01 DIAGNOSIS — R0789 Other chest pain: Secondary | ICD-10-CM | POA: Diagnosis not present

## 2019-01-01 MED ORDER — MELOXICAM 15 MG PO TABS: 15.0000 mg | ORAL_TABLET | Freq: Every day | ORAL | 0 refills | Status: DC | PRN

## 2019-01-01 NOTE — ED Triage Notes (Signed)
Pt states that she fell against a bathtub after tripping on a rug 2 days ago and now having pain in her rib cage area on the right side. She states it hurts when she breathes. Denies shortness of breath but states she is trying not to breath hard because of the pain.

## 2019-01-01 NOTE — Discharge Instructions (Signed)
Take medication as prescribed. Rest. Drink plenty of fluids. Ice.  ° °Follow up with your primary care physician this week as needed. Return to Urgent care for new or worsening concerns.  ° °

## 2019-01-01 NOTE — ED Provider Notes (Signed)
[facility] ____________________________________________  Time seen: Approximately 8:43 AM  I have reviewed the triage vital signs and the nursing notes.   HISTORY  Chief Complaint Fall   HPI Angela Mejia is a 43 y.o. female presenting for evaluation of right lateral rib pain post injury occurring 2 days ago.  States that she was coming out of the bathroom, tripped over the rug and fell hitting her right ribs against the side of the bathtub.  Reports she has had pain since.  Denies other injuries.  Has continued remain ambulatory.  States pain is mostly with movement, palpation, deep breath or hiccups.  Denies shortness of breath, but states she has guarded her breath some due to pain.  Denies chest pain.  No recent sickness or fevers.  Denies alleviating measures.  Reports otherwise doing well.  Patient's last menstrual period was 12/08/2018 (approximate). Denies pregnancy.     Past Medical History:  Diagnosis Date  . Migraine headache     Patient Active Problem List   Diagnosis Date Noted  . Left arm pain 11/08/2018    Past Surgical History:  Procedure Laterality Date  . DILATION AND CURETTAGE OF UTERUS    . TUBAL LIGATION       No current facility-administered medications for this encounter.   Current Outpatient Medications:  .  ibuprofen (ADVIL,MOTRIN) 200 MG tablet, Take 200 mg by mouth every 6 (six) hours as needed. , Disp: , Rfl:  .  meloxicam (MOBIC) 15 MG tablet, Take 1 tablet (15 mg total) by mouth daily as needed., Disp: 7 tablet, Rfl: 0  Allergies Patient has no known allergies.  History reviewed. No pertinent family history.  Social History Social History   Tobacco Use  . Smoking status: Former Games developermoker  . Smokeless tobacco: Never Used  Substance Use Topics  . Alcohol use: Yes    Comment: occasionally  . Drug use: No    Review of Systems Constitutional: No fever ENT: No sore throat. Cardiovascular: Denies chest pain.  Respiratory: Denies shortness of breath. Gastrointestinal: No abdominal pain.  No nausea, no vomiting.  No diarrhea.  No constipation. Genitourinary: Negative for dysuria. Musculoskeletal: Negative for back pain.  Positive right rib pain. Skin: Negative for rash.   ____________________________________________   PHYSICAL EXAM:  VITAL SIGNS: ED Triage Vitals  Enc Vitals Group     BP 01/01/19 0817 123/82     Pulse Rate 01/01/19 0817 70     Resp 01/01/19 0817 18     Temp 01/01/19 0817 98.4 F (36.9 C)     Temp Source 01/01/19 0817 Oral     SpO2 01/01/19 0817 100 %     Weight 01/01/19 0812 169 lb (76.7 kg)     Height 01/01/19 0812 4\' 11"  (1.499 m)     Head Circumference --      Peak Flow --      Pain Score 01/01/19 0812 8     Pain Loc --      Pain Edu? --      Excl. in GC? --     Constitutional: Alert and oriented. Well appearing and in no acute distress. Eyes: Conjunctivae are normal.  ENT      Head: Normocephalic and atraumatic. Cardiovascular: Normal rate, regular rhythm. Grossly normal heart sounds.  Good peripheral circulation. Respiratory: Normal respiratory effort without tachypnea nor retractions. Breath sounds are clear and equal bilaterally. No wheezes, rales, rhonchi. Gastrointestinal: Soft and nontender.No CVA tenderness. Musculoskeletal: No midline cervical, thoracic or  lumbar tenderness to palpation.  Except: Right lateral mid ribs along ribs 3 through 6 tenderness to direct palpation, minimal ecchymosis, no palpable rib fracture, no other tenderness to ribs noted. Neurologic:  Normal speech and language. Speech is normal. No gait instability.  Skin:  Skin is warm, dry and intact. No rash noted. Psychiatric: Mood and affect are normal. Speech and behavior are normal. Patient exhibits appropriate insight and judgment   ___________________________________________   LABS (all labs ordered are listed, but only abnormal results are displayed)  Labs Reviewed - No  data to display ____________________________________________  RADIOLOGY  Dg Ribs Unilateral W/chest Right  Result Date: 01/01/2019 CLINICAL DATA:  Right rib pain after fall 2 days ago. EXAM: RIGHT RIBS AND CHEST - 3+ VIEW COMPARISON:  Radiographs of November 08, 2018. FINDINGS: No fracture or other bone lesions are seen involving the ribs. There is no evidence of pneumothorax or pleural effusion. Both lungs are clear. Heart size and mediastinal contours are within normal limits. IMPRESSION: Negative. Electronically Signed   By: Marijo Conception M.D.   On: 01/01/2019 08:47   ____________________________________________   PROCEDURES Procedures    INITIAL IMPRESSION / ASSESSMENT AND PLAN / ED COURSE  Pertinent labs & imaging results that were available during my care of the patient were reviewed by me and considered in my medical decision making (see chart for details).  Well-appearing patient.  No acute distress.  Right rib pain post mechanical injury.  Right rib x-rays negative as above per radiologist, reviewed by myself.  Contused rib.  Encourage deep breathing, ice, evidence of strenuous activity.  Mobic comes 7 days.Discussed indication, risks and benefits of medications with patient.  Discussed follow up with Primary care physician this week as needed. Discussed follow up and return parameters including no resolution or any worsening concerns. Patient verbalized understanding and agreed to plan.   ____________________________________________   FINAL CLINICAL IMPRESSION(S) / ED DIAGNOSES  Final diagnoses:  Contusion of rib on right side, initial encounter     ED Discharge Orders         Ordered    meloxicam (MOBIC) 15 MG tablet  Daily PRN     01/01/19 0858           Note: This dictation was prepared with Dragon dictation along with smaller phrase technology. Any transcriptional errors that result from this process are unintentional.         Marylene Land, NP  01/01/19 0901

## 2019-11-17 IMAGING — CR DG FINGER MIDDLE 2+V*L*
3 series · 3 of 3 positions shown · non-contrast
Comparison: None in PACs

CLINICAL DATA: Pain, swelling, and redness of the middle finger
began this morning principally over the distal phalanx above the
cuticle and distal to the DIP joint. No known injury.

EXAM:
LEFT MIDDLE FINGER 2+V

[finger ap]
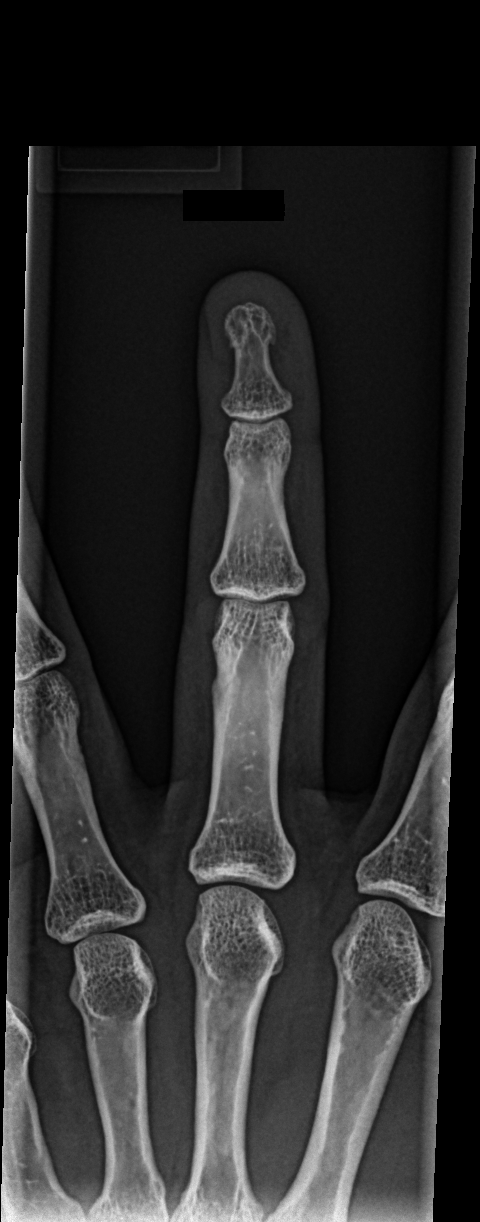

[finger obl]
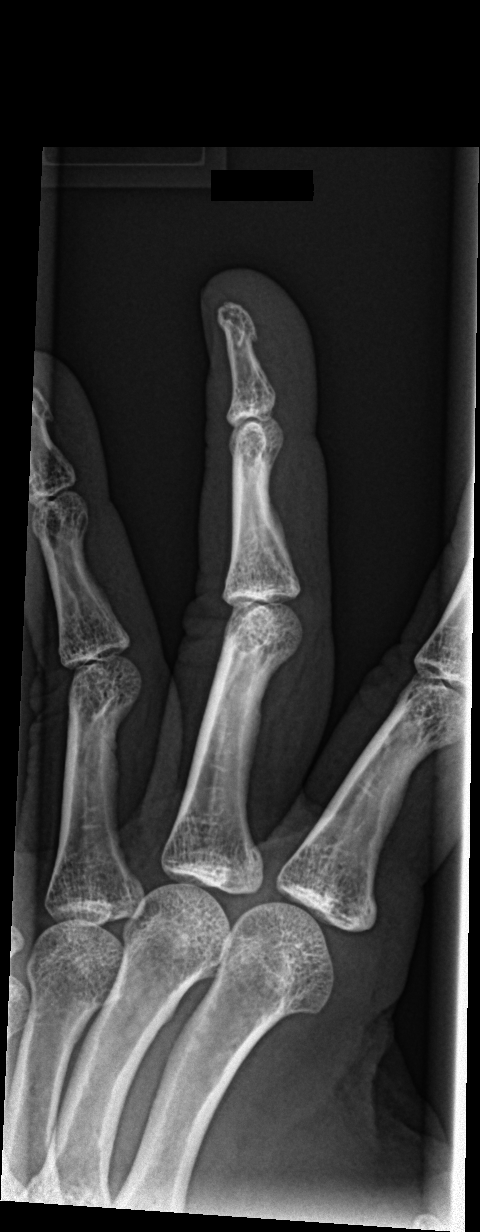

[finger lat]
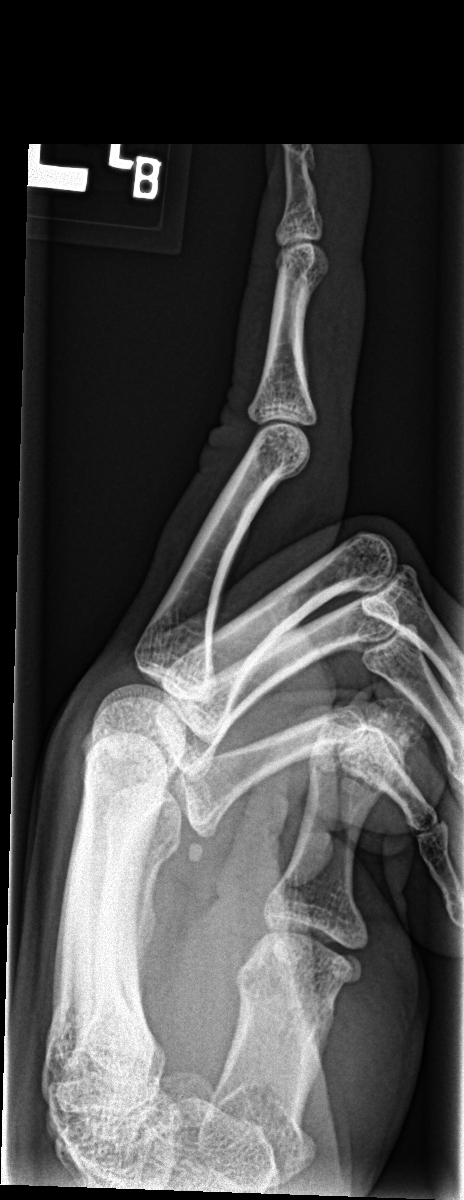

[3 of 3 positions shown; findings below may reference images not displayed]

FINDINGS: The bones are subjectively adequately mineralized. The joint spaces
are well maintained. The soft tissues exhibit no foreign bodies or
gas collections nor significant swelling.
IMPRESSION: There is no acute bony or soft tissue abnormality demonstrated.

## 2019-11-25 ENCOUNTER — Emergency Department: Payer: BC Managed Care – PPO

## 2019-11-25 ENCOUNTER — Emergency Department
Admission: EM | Admit: 2019-11-25 | Discharge: 2019-11-25 | Disposition: A | Discharge status: Home / Self Care | Payer: BC Managed Care – PPO | Attending: Emergency Medicine | Admitting: Emergency Medicine

## 2019-11-25 ENCOUNTER — Other Ambulatory Visit: Payer: Self-pay

## 2019-11-25 DIAGNOSIS — M79602 Pain in left arm: Secondary | ICD-10-CM | POA: Diagnosis not present

## 2019-11-25 DIAGNOSIS — Z5321 Procedure and treatment not carried out due to patient leaving prior to being seen by health care provider: Secondary | ICD-10-CM | POA: Diagnosis not present

## 2019-11-25 DIAGNOSIS — M542 Cervicalgia: Secondary | ICD-10-CM | POA: Insufficient documentation

## 2019-11-25 LAB — CBC
HCT: 35.4 % — ABNORMAL LOW (ref 36.0–46.0)
Hemoglobin: 12.1 g/dL (ref 12.0–15.0)
MCH: 29.2 pg (ref 26.0–34.0)
MCHC: 34.2 g/dL (ref 30.0–36.0)
MCV: 85.3 fL (ref 80.0–100.0)
Platelets: 281 10*3/uL (ref 150–400)
RBC: 4.15 MIL/uL (ref 3.87–5.11)
RDW: 12 % (ref 11.5–15.5)
WBC: 8 10*3/uL (ref 4.0–10.5)
nRBC: 0 % (ref 0.0–0.2)

## 2019-11-25 LAB — BASIC METABOLIC PANEL
Anion gap: 7 (ref 5–15)
BUN: 16 mg/dL (ref 6–20)
CO2: 22 mmol/L (ref 22–32)
Calcium: 8.4 mg/dL — ABNORMAL LOW (ref 8.9–10.3)
Chloride: 106 mmol/L (ref 98–111)
Creatinine, Ser: 0.64 mg/dL (ref 0.44–1.00)
GFR calc Af Amer: >60 mL/min (ref >60)
GFR calc non Af Amer: >60 mL/min (ref >60)
Glucose, Bld: 100 mg/dL — ABNORMAL HIGH (ref 70–99)
Potassium: 4 mmol/L (ref 3.5–5.1)
Sodium: 135 mmol/L (ref 135–145)

## 2019-11-25 LAB — TROPONIN I (HIGH SENSITIVITY): Troponin I (High Sensitivity): <2 ng/L (ref <18)

## 2019-11-25 NOTE — ED Notes (Signed)
Pt called for vital sign recheck. No answer, pt not seen in lobby

## 2019-11-25 NOTE — ED Notes (Signed)
Pt called for vital sign recheck, no answer. Pt not seen in lobby

## 2019-11-25 NOTE — ED Triage Notes (Signed)
Pt states left arm pain that began at noon while at rest. Pt states now pain radiates up left lateral neck. Pt denies chest pain.

## 2019-11-25 NOTE — ED Notes (Signed)
Pt called for vital sign recheck. No answer. Pt not seen in lobby.

## 2019-12-03 ENCOUNTER — Emergency Department
Admission: EM | Admit: 2019-12-03 | Discharge: 2019-12-03 | Disposition: A | Discharge status: Home / Self Care | Payer: BC Managed Care – PPO | Attending: Emergency Medicine | Admitting: Emergency Medicine

## 2019-12-03 ENCOUNTER — Encounter: Payer: Self-pay | Admitting: Emergency Medicine

## 2019-12-03 ENCOUNTER — Other Ambulatory Visit: Payer: Self-pay

## 2019-12-03 DIAGNOSIS — Z87891 Personal history of nicotine dependence: Secondary | ICD-10-CM | POA: Insufficient documentation

## 2019-12-03 DIAGNOSIS — R102 Pelvic and perineal pain: Secondary | ICD-10-CM | POA: Diagnosis present

## 2019-12-03 DIAGNOSIS — N730 Acute parametritis and pelvic cellulitis: Secondary | ICD-10-CM | POA: Insufficient documentation

## 2019-12-03 LAB — COMPREHENSIVE METABOLIC PANEL
ALT: 16 U/L (ref 0–44)
AST: 18 U/L (ref 15–41)
Albumin: 4.2 g/dL (ref 3.5–5.0)
Alkaline Phosphatase: 48 U/L (ref 38–126)
Anion gap: 10 (ref 5–15)
BUN: 18 mg/dL (ref 6–20)
CO2: 22 mmol/L (ref 22–32)
Calcium: 9.1 mg/dL (ref 8.9–10.3)
Chloride: 105 mmol/L (ref 98–111)
Creatinine, Ser: 0.53 mg/dL (ref 0.44–1.00)
GFR calc Af Amer: >60 mL/min (ref >60)
GFR calc non Af Amer: >60 mL/min (ref >60)
Glucose, Bld: 103 mg/dL — ABNORMAL HIGH (ref 70–99)
Potassium: 4.4 mmol/L (ref 3.5–5.1)
Sodium: 137 mmol/L (ref 135–145)
Total Bilirubin: 0.5 mg/dL (ref 0.3–1.2)
Total Protein: 7.4 g/dL (ref 6.5–8.1)

## 2019-12-03 LAB — URINALYSIS, COMPLETE (UACMP) WITH MICROSCOPIC
Bacteria, UA: NONE SEEN
Bilirubin Urine: NEGATIVE
Glucose, UA: NEGATIVE mg/dL
Ketones, ur: NEGATIVE mg/dL
Leukocytes,Ua: NEGATIVE
Nitrite: NEGATIVE
Protein, ur: NEGATIVE mg/dL
Specific Gravity, Urine: 1.02 (ref 1.005–1.030)
pH: 6 (ref 5.0–8.0)

## 2019-12-03 LAB — CBC
HCT: 35.5 % — ABNORMAL LOW (ref 36.0–46.0)
Hemoglobin: 12.4 g/dL (ref 12.0–15.0)
MCH: 29.4 pg (ref 26.0–34.0)
MCHC: 34.9 g/dL (ref 30.0–36.0)
MCV: 84.1 fL (ref 80.0–100.0)
Platelets: 273 10*3/uL (ref 150–400)
RBC: 4.22 MIL/uL (ref 3.87–5.11)
RDW: 11.9 % (ref 11.5–15.5)
WBC: 10.1 10*3/uL (ref 4.0–10.5)
nRBC: 0 % (ref 0.0–0.2)

## 2019-12-03 LAB — WET PREP, GENITAL
Clue Cells Wet Prep HPF POC: NONE SEEN
Sperm: NONE SEEN
Trich, Wet Prep: NONE SEEN
Yeast Wet Prep HPF POC: NONE SEEN

## 2019-12-03 LAB — POCT PREGNANCY, URINE: Preg Test, Ur: NEGATIVE

## 2019-12-03 LAB — CHLAMYDIA/NGC RT PCR (ARMC ONLY)
Chlamydia Tr: NOT DETECTED
N gonorrhoeae: NOT DETECTED

## 2019-12-03 MED ORDER — IBUPROFEN 400 MG PO TABS
400.0000 mg | ORAL_TABLET | Freq: Once | ORAL | Status: AC | PRN
  Administered 2019-12-03: 400 mg via ORAL
  Filled 2019-12-03: qty 1

## 2019-12-03 MED ORDER — AZITHROMYCIN 500 MG PO TABS
1000.0000 mg | ORAL_TABLET | Freq: Every day | ORAL | Status: DC
  Administered 2019-12-03: 1000 mg via ORAL
  Filled 2019-12-03: qty 2

## 2019-12-03 MED ORDER — DOXYCYCLINE HYCLATE 100 MG PO CAPS: 100.0000 mg | ORAL_CAPSULE | Freq: Two times a day (BID) | ORAL | 0 refills | Status: DC

## 2019-12-03 MED ORDER — CEFTRIAXONE SODIUM 1 G IJ SOLR
500.0000 mg | Freq: Once | INTRAMUSCULAR | Status: AC
  Administered 2019-12-03: 500 mg via INTRAMUSCULAR
  Filled 2019-12-03: qty 10

## 2019-12-03 NOTE — ED Triage Notes (Addendum)
Patient states that she had a sudden onset of intermittent pain in her lower abdomen that started about 03:00 this am. Patient denies urinary symptoms.

## 2019-12-03 NOTE — ED Provider Notes (Signed)
[facility] Emergency Department Provider Note  ____________________________________________  Time seen: Approximately 8:13 AM  I have reviewed the triage vital signs and the nursing notes.   HISTORY  Chief Complaint Pelvic Pain    HPI Angela Mejia is a 44 y.o. female who presents to the emergency department for treatment and evaluation of pelvic pain.   Since onset of intermittent pain in her lower abdomen started about 3:00 this morning.  No urinary symptoms.  She states that "it even hurts to walk."  She has had some vaginal discharge but no known exposure to STDs.  Past Medical History:  Diagnosis Date  . Migraine headache     Patient Active Problem List   Diagnosis Date Noted  . Left arm pain 11/08/2018    Past Surgical History:  Procedure Laterality Date  . DILATION AND CURETTAGE OF UTERUS    . TUBAL LIGATION      Prior to Admission medications   Medication Sig Start Date End Date Taking? Authorizing Provider  doxycycline (VIBRAMYCIN) 100 MG capsule Take 1 capsule (100 mg total) by mouth 2 (two) times daily. 12/03/19   Chinita Pester, FNP    Allergies Patient has no known allergies.  No family history on file.  Social History Social History   Tobacco Use  . Smoking status: Former Games developer  . Smokeless tobacco: Never Used  Vaping Use  . Vaping Use: Never used  Substance Use Topics  . Alcohol use: Yes    Comment: occasionally  . Drug use: No    Review of Systems Constitutional: Negative for fever. Respiratory: Negative for shortness of breath or cough. Gastrointestinal: Positive for abdominal pain; negative for nausea , negative for vomiting. Genitourinary: Negative for dysuria , positive for vaginal discharge. Musculoskeletal: Negative for back pain. Skin: Negative for acute skin changes/rash/lesion. ____________________________________________   PHYSICAL EXAM:  VITAL SIGNS: ED Triage Vitals  Enc Vitals Group      BP 12/03/19 0404 121/75     Pulse Rate 12/03/19 0404 78     Resp 12/03/19 0404 18     Temp 12/03/19 0404 98.3 F (36.8 C)     Temp Source 12/03/19 0404 Oral     SpO2 12/03/19 0404 100 %     Weight 12/03/19 0405 174 lb (78.9 kg)     Height 12/03/19 0405 4\' 11"  (1.499 m)     Head Circumference --      Peak Flow --      Pain Score 12/03/19 0405 10     Pain Loc --      Pain Edu? --      Excl. in GC? --     Constitutional: Alert and oriented. Well appearing and in no acute distress. Eyes: Conjunctivae are normal. Head: Atraumatic. Nose: No congestion/rhinnorhea. Mouth/Throat: Mucous membranes are moist. Respiratory: Normal respiratory effort.  No retractions. Gastrointestinal: Bowel sounds active x 4; Abdomen is soft without rebound or guarding. Genitourinary: Pelvic exam: Cervix closed, discharge noted from the os, positive chandelier sign. Musculoskeletal: No extremity tenderness nor edema.  Neurologic:  Normal speech and language. No gross focal neurologic deficits are appreciated. Speech is normal. No gait instability. Skin:  Skin is warm, dry and intact. No rash noted on exposed skin. Psychiatric: Mood and affect are normal. Speech and behavior are normal.  ____________________________________________   LABS (all labs ordered are listed, but only abnormal results are displayed)  Labs Reviewed  WET PREP, GENITAL - Abnormal; Notable for the following components:  Result Value   WBC, Wet Prep HPF POC FEW (*)    All other components within normal limits  COMPREHENSIVE METABOLIC PANEL - Abnormal; Notable for the following components:   Glucose, Bld 103 (*)    All other components within normal limits  CBC - Abnormal; Notable for the following components:   HCT 35.5 (*)    All other components within normal limits  URINALYSIS, COMPLETE (UACMP) WITH MICROSCOPIC - Abnormal; Notable for the following components:   Color, Urine YELLOW (*)    APPearance HAZY (*)    Hgb urine  dipstick SMALL (*)    All other components within normal limits  CHLAMYDIA/NGC RT PCR ([facility] ONLY)  POC URINE PREG, ED  POCT PREGNANCY, URINE   ____________________________________________  RADIOLOGY  Not indicated. ____________________________________________  Procedures  ____________________________________________  44 year old female presenting to the emergency department for treatment and evaluation of pelvic pain and vaginal discharge.  See HPI for further details.  According to labs, patient does not have STI however on exam she does have positive chandelier sign with copious amount of vaginal discharge and pain during ambulation.  She will be treated for PID and will be given a dose of Rocephin, azithromycin and then discharged home with doxycycline.  Patient was advised to follow-up with gynecology or the health department if not improving over the the next day or so.  INITIAL IMPRESSION / ASSESSMENT AND PLAN / ED COURSE  Pertinent labs & imaging results that were available during my care of the patient were reviewed by me and considered in my medical decision making (see chart for details).  ____________________________________________   FINAL CLINICAL IMPRESSION(S) / ED DIAGNOSES  Final diagnoses:  Pelvic pain in female  Acute pelvic inflammatory disease (PID)    Note:  This document was prepared using Dragon voice recognition software and may include unintentional dictation errors.   Chinita Pester, FNP 12/04/19 1209    Jene Every, MD 12/04/19 1356

## 2021-09-30 ENCOUNTER — Other Ambulatory Visit: Payer: Self-pay | Admitting: Student

## 2021-09-30 DIAGNOSIS — Z1231 Encounter for screening mammogram for malignant neoplasm of breast: Secondary | ICD-10-CM

## 2021-10-01 ENCOUNTER — Other Ambulatory Visit: Payer: Self-pay | Admitting: *Deleted

## 2021-10-01 ENCOUNTER — Inpatient Hospital Stay
Admission: RE | Admit: 2021-10-01 | Discharge: 2021-10-01 | Disposition: A | Discharge status: Home / Self Care | Payer: Self-pay | Source: Ambulatory Visit | Attending: *Deleted | Admitting: *Deleted

## 2021-10-01 ENCOUNTER — Ambulatory Visit
Admission: RE | Admit: 2021-10-01 | Discharge: 2021-10-01 | Disposition: A | Discharge status: Home / Self Care | Payer: BC Managed Care – PPO | Source: Ambulatory Visit | Attending: Student | Admitting: Student

## 2021-10-01 DIAGNOSIS — Z1231 Encounter for screening mammogram for malignant neoplasm of breast: Secondary | ICD-10-CM | POA: Diagnosis present

## 2021-12-11 ENCOUNTER — Ambulatory Visit
Admission: EM | Admit: 2021-12-11 | Discharge: 2021-12-11 | Disposition: A | Discharge status: Home / Self Care | Payer: BC Managed Care – PPO | Attending: Urgent Care | Admitting: Urgent Care

## 2021-12-11 DIAGNOSIS — H209 Unspecified iridocyclitis: Secondary | ICD-10-CM

## 2021-12-11 MED ORDER — TOBRAMYCIN-DEXAMETHASONE 0.3-0.1 % OP SUSP: 1.0000 [drp] | OPHTHALMIC | 0 refills | Status: DC

## 2021-12-11 MED ORDER — TOBRAMYCIN 0.3 % OP SOLN: 1.0000 [drp] | OPHTHALMIC | 0 refills | Status: DC

## 2021-12-11 NOTE — ED Triage Notes (Signed)
Patient presents to Urgent Care with complaints of right eye redness, pain since days ago. Used her contact eye drops no relief. Pt reports intermittent blurry vision x 2 weeks. Pt states she has appt with eye doctor 09/14. Pain worse today.   Denies fever, drainage.

## 2021-12-11 NOTE — ED Provider Notes (Signed)
Angela Mejia   MRN: [id number] DOB: January 06, 1976  Subjective:   Angela Mejia is a 46 y.o. female presenting for 2-week history of intermittent blurred vision now having pain of both eyes worse in the right eye.  She is also had some redness, slight photophobia.  She has been using over the counter eyedrops without any relief.  She does wear contacts and has been doing so for years.  No eye trauma, foreign body sensation, drainage.  No current facility-administered medications for this encounter.  Current Outpatient Medications:    ERGOCALCIFEROL PO, Take 1 capsule by mouth once a week., Disp: , Rfl:    doxycycline (VIBRAMYCIN) 100 MG capsule, Take 1 capsule (100 mg total) by mouth 2 (two) times daily., Disp: 20 capsule, Rfl: 0   meloxicam (MOBIC) 15 MG tablet, Take 15 mg by mouth daily., Disp: , Rfl:    Naproxen (NAPROSYN PO), , Disp: , Rfl:    No Known Allergies  Past Medical History:  Diagnosis Date   Migraine headache      Past Surgical History:  Procedure Laterality Date   DILATION AND CURETTAGE OF UTERUS     TUBAL LIGATION      Family History  Problem Relation Age of Onset   Breast cancer Mother     Social History   Tobacco Use   Smoking status: Former    Types: Cigarettes   Smokeless tobacco: Never  Vaping Use   Vaping Use: Never used  Substance Use Topics   Alcohol use: Yes    Comment: occasionally   Drug use: No    ROS   Objective:   Vitals: BP 133/85   Pulse 69   Temp 98.1 F (36.7 C)   Resp 18   LMP 10/23/2021   SpO2 97%    Physical Exam Constitutional:      General: She is not in acute distress.    Appearance: Normal appearance. She is well-developed. She is not ill-appearing, toxic-appearing or diaphoretic.  HENT:     Head: Normocephalic and atraumatic.     Nose: Nose normal.     Mouth/Throat:     Mouth: Mucous membranes are moist.  Eyes:     General: Lids are normal. Lids are everted, no foreign bodies appreciated.  Vision grossly intact. No scleral icterus.       Right eye: No foreign body, discharge or hordeolum.        Left eye: No foreign body, discharge or hordeolum.     Extraocular Movements: Extraocular movements intact.     Right eye: Normal extraocular motion.     Left eye: Normal extraocular motion and no nystagmus.     Conjunctiva/sclera:     Right eye: Right conjunctiva is injected (R>L). No chemosis, exudate or hemorrhage.    Left eye: Left conjunctiva is injected. No chemosis, exudate or hemorrhage.  Cardiovascular:     Rate and Rhythm: Normal rate.  Pulmonary:     Effort: Pulmonary effort is normal.  Skin:    General: Skin is warm and dry.  Neurological:     General: No focal deficit present.     Mental Status: She is alert and oriented to person, place, and time.  Psychiatric:        Mood and Affect: Mood normal.        Behavior: Behavior normal.     Assessment and Plan :   PDMP not reviewed this encounter.  1. Iritis     Patient has a  distinct red ring around the iris of the right eye.  The conjunctive is otherwise slightly injected bilaterally.  I have suspicion for iritis and recommended using TobraDex for the right eye, tobramycin for the left eye.  She needs to follow-up with an eye doctor sooner than her appointment in 3 weeks.  Recommended calling for consultation with Dr. Darcel Bayley, the ophthalmologist on-call through [facility]. Counseled patient on potential for adverse effects with medications prescribed/recommended today, ER and return-to-clinic precautions discussed, patient verbalized understanding.    Wallis Bamberg, PA-C 12/11/21 2010

## 2023-05-25 ENCOUNTER — Ambulatory Visit: Payer: BC Managed Care – PPO

## 2023-05-25 ENCOUNTER — Ambulatory Visit
Admission: EM | Admit: 2023-05-25 | Discharge: 2023-05-25 | Disposition: A | Discharge status: Home / Self Care | Payer: BC Managed Care – PPO | Attending: Family Medicine | Admitting: Family Medicine

## 2023-05-25 ENCOUNTER — Encounter: Payer: Self-pay | Admitting: Emergency Medicine

## 2023-05-25 DIAGNOSIS — M25562 Pain in left knee: Secondary | ICD-10-CM | POA: Diagnosis not present

## 2023-05-25 DIAGNOSIS — W19XXXA Unspecified fall, initial encounter: Secondary | ICD-10-CM

## 2023-05-25 DIAGNOSIS — M170 Bilateral primary osteoarthritis of knee: Secondary | ICD-10-CM

## 2023-05-25 DIAGNOSIS — S63502A Unspecified sprain of left wrist, initial encounter: Secondary | ICD-10-CM | POA: Diagnosis not present

## 2023-05-25 DIAGNOSIS — M25561 Pain in right knee: Secondary | ICD-10-CM

## 2023-05-25 HISTORY — DX: Unspecified osteoarthritis, unspecified site: M19.90

## 2023-05-25 MED ORDER — NAPROXEN 500 MG PO TABS: 500.0000 mg | ORAL_TABLET | Freq: Two times a day (BID) | ORAL | 0 refills | Status: AC

## 2023-05-25 MED ORDER — METHOCARBAMOL 500 MG PO TABS: 500.0000 mg | ORAL_TABLET | Freq: Two times a day (BID) | ORAL | 0 refills | Status: AC

## 2023-05-25 NOTE — ED Triage Notes (Signed)
 Patient states she fell and work today.  Patient c/o bilateral knee and left hand/palm discomfort.

## 2023-05-25 NOTE — Discharge Instructions (Addendum)
 On my review of your xray knee and wrist images, you did not have any fractures or dislocated bones . The radiologist agrees.  You should see your results in MyChart.   If medication was prescribed, stop by the pharmacy to pick up your prescriptions.  For your  pain, Take 1500 mg Tylenol  twice a day, take muscle relaxer (methocarbomal/Robaxin ) twice a day, take Naprosyn  twice a day,  as needed for pain. Rest and elevate the affected painful area.  Apply warm  compresses intermittently, as needed.  As pain recedes, begin normal activities slowly as tolerated.  Follow up with primary care provider or an orthopedic provider, if symptoms persist.  Watch for worsening symptoms such as an increasing weakness or loss of sensation, increasing pain and/or the loss of bladder or bowel function. Should any of these occur, go to the emergency department immediately.

## 2023-05-25 NOTE — ED Provider Notes (Signed)
 [facility]    CSN: [id number] Arrival date & time: 05/25/23  1856      History   Chief Complaint Chief Complaint  Patient presents with   Fall    HPI  HPI Angela Mejia is a 48 y.o. female.   Angela Mejia presents for bilateral knee and left wrist pain after falling this afternoon at work.  She states she tripped over a power cord that was in her path.  Has scape across her knees and that area hurts. She applied some ice to the wrist. Has trouble moving her       Past Medical History:  Diagnosis Date   Arthritis    Migraine headache     Patient Active Problem List   Diagnosis Date Noted   Left arm pain 11/08/2018    Past Surgical History:  Procedure Laterality Date   DILATION AND CURETTAGE OF UTERUS     TUBAL LIGATION      OB History   No obstetric history on file.      Home Medications    Prior to Admission medications   Medication Sig Start Date End Date Taking? Authorizing Provider  methocarbamol  (ROBAXIN ) 500 MG tablet Take 1 tablet (500 mg total) by mouth 2 (two) times daily. 05/25/23  Yes Keiran Gaffey, DO  naproxen  (NAPROSYN ) 500 MG tablet Take 1 tablet (500 mg total) by mouth 2 (two) times daily with a meal. 05/25/23  Yes Zyiah Withington, Caprice, DO    Family History Family History  Problem Relation Age of Onset   Breast cancer Mother     Social History Social History   Tobacco Use   Smoking status: Former    Types: Cigarettes   Smokeless tobacco: Never  Vaping Use   Vaping status: Never Used  Substance Use Topics   Alcohol use: Yes    Comment: occasionally   Drug use: No     Allergies   Patient has no known allergies.   Review of Systems Review of Systems: :negative unless otherwise stated in HPI.      Physical Exam Triage Vital Signs ED Triage Vitals  Encounter Vitals Group     BP 05/25/23 1906 131/87     Systolic BP Percentile --      Diastolic BP Percentile --      Pulse Rate 05/25/23 1906 68     Resp  05/25/23 1906 18     Temp 05/25/23 1906 98.1 F (36.7 C)     Temp Source 05/25/23 1906 Oral     SpO2 05/25/23 1906 98 %     Weight --      Height --      Head Circumference --      Peak Flow --      Pain Score 05/25/23 1908 7     Pain Loc --      Pain Education --      Exclude from Growth Chart --    No data found.  Updated Vital Signs BP 131/87 (BP Location: Right Arm)   Pulse 68   Temp 98.1 F (36.7 C) (Oral)   Resp 18   LMP 05/19/2023   SpO2 98%   Visual Acuity Right Eye Distance:   Left Eye Distance:   Bilateral Distance:    Right Eye Near:   Left Eye Near:    Bilateral Near:     Physical Exam GEN: well appearing female in no acute distress  CVS: well perfused  RESP: speaking in  full sentences without pause, no respiratory distress  MSK:  Bilateral  Knee Exam -Inspection: no deformity, no discoloration -Palpation: medial joint line tenderness bilaterally with effusion on the right, patellar tenderness bilaterally  -ROM: full extension but somewhat limited flexion due to pain -Special Tests: Varus Stress: Negative; Valgus Stress: Negative; Anterior: Negative; Posterior drawer: Patellar grind: attempted but pt did not tolerate  -Limb neurovascularly intact, no instability noted  Left Hand: Inspection: No obvious deformity b/l. No swelling, erythema or bruising b/l Palpation:  TTP of the central carpals, no distal forearm or elbow swelling ROM: ROM of the digits and wrist b/l. No swelling in PIP, DIP joints b/l. Flexor digitorum profundus and superficialis tendon functions are intact.  PIP joint collateral ligaments are stable  Strength: 5/5 strength in the forearm, wrist and interosseus muscles b/l Neurovascular: NV intact b/l Special tests: attempted but pt didn't tolerate the movements     UC Treatments / Results  Labs (all labs ordered are listed, but only abnormal results are displayed) Labs Reviewed - No data to display  EKG   Radiology DG  Wrist Complete Left Result Date: 05/25/2023 CLINICAL DATA:  Fall, left wrist pain EXAM: LEFT WRIST - COMPLETE 3+ VIEW COMPARISON:  None Available. FINDINGS: There is no evidence of fracture or dislocation. There is no evidence of arthropathy or other focal bone abnormality. Soft tissues are unremarkable. IMPRESSION: Negative. Electronically Signed   By: Dorethia Molt M.D.   On: 05/25/2023 20:06   DG Knee Complete 4 Views Right Result Date: 05/25/2023 CLINICAL DATA:  Fall, right knee pain EXAM: RIGHT KNEE - COMPLETE 4+ VIEW COMPARISON:  None Available. FINDINGS: No evidence of fracture, dislocation, or joint effusion. No evidence of arthropathy or other focal bone abnormality. Soft tissues are unremarkable. IMPRESSION: Negative. Electronically Signed   By: Dorethia Molt M.D.   On: 05/25/2023 20:06   DG Knee Complete 4 Views Left Result Date: 05/25/2023 CLINICAL DATA:  Fall, left knee pain EXAM: LEFT KNEE - COMPLETE 4+ VIEW COMPARISON:  None Available. FINDINGS: No evidence of fracture, dislocation, or joint effusion. No evidence of arthropathy or other focal bone abnormality. Soft tissues are unremarkable. IMPRESSION: Negative. Electronically Signed   By: Dorethia Molt M.D.   On: 05/25/2023 20:05     Procedures Procedures (including critical care time)  Medications Ordered in UC Medications - No data to display  Initial Impression / Assessment and Plan / UC Course  I have reviewed the triage vital signs and the nursing notes.  Pertinent labs & imaging results that were available during my care of the patient were reviewed by me and considered in my medical decision making (see chart for details).      Pt is a 48 y.o.  female with bilateral knee pain and left wrist pain after a fall today.  On exam, pt has bilaterally patellar tenderness and left carpal tenderness concerning for fracture.   Obtained bilateral knee and left wrist plain films.  Personally interpreted by me were unremarkable for  fracture or dislocation. Radiologist report reviewed. Patient declined pain control here. She was given a left wrist brace spica prior to discharge.    Patient to gradually return to normal activities, as tolerated and continue ordinary activities within the limits permitted by pain. Prescribed Naproxen  sodium  and muscle relaxer  for pain relief.  Tylenol  PRN. Advised patient to avoid OTC NSAIDs while taking prescription NSAID. Counseled patient on red flag symptoms and when to seek immediate care.  Patient to follow up with orthopedic provider, if symptoms do not improve with conservative treatment.  Return and ED precautions given. Understanding voiced. Discussed MDM, treatment plan and plan for follow-up with patient who agrees with plan.   Final Clinical Impressions(s) / UC Diagnoses   Final diagnoses:  Fall, initial encounter  Acute bilateral knee pain  Sprain of left wrist, initial encounter     Discharge Instructions      On my review of your xray knee and wrist images, you did not have any fractures or dislocated bones . The radiologist agrees.  You should see your results in MyChart.   If medication was prescribed, stop by the pharmacy to pick up your prescriptions.  For your  pain, Take 1500 mg Tylenol  twice a day, take muscle relaxer (methocarbomal/Robaxin ) twice a day, take Naprosyn  twice a day,  as needed for pain. Rest and elevate the affected painful area.  Apply warm  compresses intermittently, as needed.  As pain recedes, begin normal activities slowly as tolerated.  Follow up with primary care provider or an orthopedic provider, if symptoms persist.  Watch for worsening symptoms such as an increasing weakness or loss of sensation, increasing pain and/or the loss of bladder or bowel function. Should any of these occur, go to the emergency department immediately.         ED Prescriptions     Medication Sig Dispense Auth. Provider   naproxen  (NAPROSYN ) 500 MG tablet  Take 1 tablet (500 mg total) by mouth 2 (two) times daily with a meal. 30 tablet Jenae Tomasello, DO   methocarbamol  (ROBAXIN ) 500 MG tablet Take 1 tablet (500 mg total) by mouth 2 (two) times daily. 20 tablet Kellan Boehlke, DO      PDMP not reviewed this encounter.   Lavenia Stumpo, DO 05/28/23 1505

## 2023-11-04 ENCOUNTER — Other Ambulatory Visit: Payer: Self-pay

## 2023-11-04 ENCOUNTER — Emergency Department
Admission: EM | Admit: 2023-11-04 | Discharge: 2023-11-04 | Disposition: A | Discharge status: Home / Self Care | Attending: Emergency Medicine | Admitting: Emergency Medicine

## 2023-11-04 DIAGNOSIS — W260XXA Contact with knife, initial encounter: Secondary | ICD-10-CM | POA: Insufficient documentation

## 2023-11-04 DIAGNOSIS — R030 Elevated blood-pressure reading, without diagnosis of hypertension: Secondary | ICD-10-CM

## 2023-11-04 DIAGNOSIS — Z23 Encounter for immunization: Secondary | ICD-10-CM | POA: Diagnosis not present

## 2023-11-04 DIAGNOSIS — I10 Essential (primary) hypertension: Secondary | ICD-10-CM | POA: Diagnosis not present

## 2023-11-04 DIAGNOSIS — Z87891 Personal history of nicotine dependence: Secondary | ICD-10-CM | POA: Diagnosis not present

## 2023-11-04 DIAGNOSIS — S61213A Laceration without foreign body of left middle finger without damage to nail, initial encounter: Secondary | ICD-10-CM | POA: Insufficient documentation

## 2023-11-04 MED ORDER — TETANUS-DIPHTH-ACELL PERTUSSIS 5-2.5-18.5 LF-MCG/0.5 IM SUSY
0.5000 mL | PREFILLED_SYRINGE | Freq: Once | INTRAMUSCULAR | Status: AC
  Administered 2023-11-04: 0.5 mL via INTRAMUSCULAR
  Filled 2023-11-04: qty 0.5

## 2023-11-04 NOTE — ED Triage Notes (Signed)
 Pt states she cut her R middle finger, pt states happened from serrated knife while cooking, bleeding controlled. NAD noted. No blood thinner use.

## 2023-11-04 NOTE — ED Provider Notes (Signed)
 [facility] Provider Note    Event Date/Time   First MD Initiated Contact with Patient 11/04/23 1045     (approximate)   History   Laceration   HPI  Angela Mejia is a 48 y.o. female   presents to the ED with complaint of laceration to her left third finger.  Patient states it happened with a serrated knife while she was cooking.  Patient is unsure of her tetanus immunization.  Patient has history of arthritis and migraine headaches.  She was a former smoker.      Physical Exam   Triage Vital Signs: ED Triage Vitals  Encounter Vitals Group     BP 11/04/23 1022 (!) 148/101     Girls Systolic BP Percentile --      Girls Diastolic BP Percentile --      Boys Systolic BP Percentile --      Boys Diastolic BP Percentile --      Pulse Rate 11/04/23 1022 68     Resp 11/04/23 1022 18     Temp 11/04/23 1022 97.9 F (36.6 C)     Temp Source 11/04/23 1022 Oral     SpO2 11/04/23 1022 100 %     Weight 11/04/23 1023 173 lb 15.1 oz (78.9 kg)     Height 11/04/23 1023 4' 11 (1.499 m)     Head Circumference --      Peak Flow --      Pain Score 11/04/23 1023 4     Pain Loc --      Pain Education --      Exclude from Growth Chart --     Most recent vital signs: Vitals:   11/04/23 1022  BP: (!) 148/101  Pulse: 68  Resp: 18  Temp: 97.9 F (36.6 C)  SpO2: 100%     General: Awake, no distress.  CV:  Good peripheral perfusion.  Resp:  Normal effort.  Abd:  No distention.  Other:  Left hand third digit volar aspect of the distal phalanx with a superficial flap laceration.  No active bleeding at this time.  Flap appears to be partially nonviable.  No foreign body noted.  Nail is not involved.  Range of motion is not restricted.  Area is approximately 0.5 cm in diameter.   ED Results / Procedures / Treatments   Labs (all labs ordered are listed, but only abnormal results are displayed) Labs Reviewed - No data to  display    PROCEDURES:  Critical Care performed:   Procedures   MEDICATIONS ORDERED IN ED: Medications  Tdap (BOOSTRIX) injection 0.5 mL (has no administration in time range)     IMPRESSION / MDM / ASSESSMENT AND PLAN / ED COURSE  I reviewed the triage vital signs and the nursing notes.   Differential diagnosis includes, but is not limited to, laceration left third digit, skin avulsion, tetanus immunization.  48 year old female presents to the ED with a superficial flap laceration to her left third digit while cutting at home with a knife.  Tetanus immunization was updated.  Patient is aware that most likely this small flap may fall off as it does not appear to be viable.  I stressed that she should watch for any signs of infection and follow-up with her PCP or return to the emergency department.  Also recheck her blood pressure as it was elevated in the emergency department as she does not have a history of hypertension.  Patient's presentation is most consistent with acute illness / injury with system symptoms.  FINAL CLINICAL IMPRESSION(S) / ED DIAGNOSES   Final diagnoses:  Laceration of left middle finger without foreign body without damage to nail, initial encounter  Elevated blood pressure reading     Rx / DC Orders   ED Discharge Orders     None        Note:  This document was prepared using Dragon voice recognition software and may include unintentional dictation errors.   Saunders Shona CROME, PA-C 11/04/23 1159    Claudene Rover, MD 11/04/23 [phone]

## 2023-11-04 NOTE — ED Notes (Addendum)
 See triage note  Presents with small laceration to left middle finger  states she was cutting cheese

## 2023-11-04 NOTE — Discharge Instructions (Signed)
 Follow-up with your primary care provider if any continued problems or concerns.  Also have them recheck your blood pressure as it was elevated while in the emergency department.  Watch the finger laceration for any signs of infection.  Most likely the flap of skin will fall off as it does not appear to have a blood supply to it.  Take Tylenol  if needed for pain.
